# Patient Record
Sex: Male | Born: 1960 | Race: White | Hispanic: No | Marital: Single | Smoking: Never smoker
Health system: Southern US, Community
[De-identification: ages and names within clinical notes are randomized; demographics above are authoritative.]

## PROBLEM LIST (undated history)

## (undated) DIAGNOSIS — I10 Essential (primary) hypertension: Secondary | ICD-10-CM

## (undated) DIAGNOSIS — A4902 Methicillin resistant Staphylococcus aureus infection, unspecified site: Secondary | ICD-10-CM

## (undated) DIAGNOSIS — T8859XA Other complications of anesthesia, initial encounter: Secondary | ICD-10-CM

## (undated) DIAGNOSIS — E785 Hyperlipidemia, unspecified: Secondary | ICD-10-CM

## (undated) DIAGNOSIS — E119 Type 2 diabetes mellitus without complications: Secondary | ICD-10-CM

## (undated) DIAGNOSIS — Z89429 Acquired absence of other toe(s), unspecified side: Secondary | ICD-10-CM

## (undated) DIAGNOSIS — G4730 Sleep apnea, unspecified: Secondary | ICD-10-CM

## (undated) HISTORY — DX: Acquired absence of other toe(s), unspecified side: Z89.429

## (undated) HISTORY — DX: Hyperlipidemia, unspecified: E78.5

## (undated) HISTORY — PX: HERNIA REPAIR: SHX51

## (undated) HISTORY — PX: TOE AMPUTATION: SHX809

## (undated) HISTORY — PX: APPENDECTOMY: SHX54

## (undated) HISTORY — PX: TRACHEOSTOMY: SUR1362

## (undated) HISTORY — DX: Methicillin resistant Staphylococcus aureus infection, unspecified site: A49.02

---

## 2000-10-31 ENCOUNTER — Emergency Department (HOSPITAL_COMMUNITY): Admission: EM | Admit: 2000-10-31 | Discharge: 2000-10-31 | Payer: Self-pay | Admitting: *Deleted

## 2000-12-09 ENCOUNTER — Encounter: Payer: Self-pay | Admitting: Gastroenterology

## 2000-12-09 ENCOUNTER — Ambulatory Visit (HOSPITAL_COMMUNITY): Admission: RE | Admit: 2000-12-09 | Discharge: 2000-12-09 | Payer: Self-pay | Admitting: Gastroenterology

## 2001-01-06 ENCOUNTER — Encounter (INDEPENDENT_AMBULATORY_CARE_PROVIDER_SITE_OTHER): Payer: Self-pay | Admitting: Specialist

## 2001-01-06 ENCOUNTER — Ambulatory Visit (HOSPITAL_COMMUNITY): Admission: RE | Admit: 2001-01-06 | Discharge: 2001-01-06 | Payer: Self-pay | Admitting: Gastroenterology

## 2001-01-24 ENCOUNTER — Emergency Department (HOSPITAL_COMMUNITY): Admission: EM | Admit: 2001-01-24 | Discharge: 2001-01-24 | Payer: Self-pay

## 2001-07-21 HISTORY — PX: COLONOSCOPY: SHX174

## 2006-03-30 ENCOUNTER — Emergency Department (HOSPITAL_COMMUNITY): Admission: EM | Admit: 2006-03-30 | Discharge: 2006-03-31 | Payer: Self-pay | Admitting: Emergency Medicine

## 2007-01-20 ENCOUNTER — Emergency Department (HOSPITAL_COMMUNITY): Admission: EM | Admit: 2007-01-20 | Discharge: 2007-01-20 | Payer: Self-pay | Admitting: Emergency Medicine

## 2007-01-22 ENCOUNTER — Emergency Department (HOSPITAL_COMMUNITY): Admission: EM | Admit: 2007-01-22 | Discharge: 2007-01-22 | Payer: Self-pay | Admitting: Emergency Medicine

## 2008-04-14 ENCOUNTER — Encounter: Admission: RE | Admit: 2008-04-14 | Discharge: 2008-04-14 | Payer: Self-pay | Admitting: Surgery

## 2009-01-14 ENCOUNTER — Ambulatory Visit: Payer: Self-pay | Admitting: Cardiology

## 2011-07-18 ENCOUNTER — Emergency Department (HOSPITAL_COMMUNITY): Payer: BC Managed Care – PPO

## 2011-07-18 ENCOUNTER — Emergency Department (HOSPITAL_COMMUNITY)
Admission: EM | Admit: 2011-07-18 | Discharge: 2011-07-18 | Disposition: A | Discharge status: Home / Self Care | Payer: BC Managed Care – PPO | Attending: Emergency Medicine | Admitting: Emergency Medicine

## 2011-07-18 ENCOUNTER — Encounter: Payer: Self-pay | Admitting: Emergency Medicine

## 2011-07-18 DIAGNOSIS — R51 Headache: Secondary | ICD-10-CM | POA: Insufficient documentation

## 2011-07-18 DIAGNOSIS — J3489 Other specified disorders of nose and nasal sinuses: Secondary | ICD-10-CM | POA: Insufficient documentation

## 2011-07-18 DIAGNOSIS — J45909 Unspecified asthma, uncomplicated: Secondary | ICD-10-CM | POA: Insufficient documentation

## 2011-07-18 DIAGNOSIS — J069 Acute upper respiratory infection, unspecified: Secondary | ICD-10-CM | POA: Insufficient documentation

## 2011-07-18 DIAGNOSIS — R0981 Nasal congestion: Secondary | ICD-10-CM

## 2011-07-18 MED ORDER — SALINE NASAL SPRAY 0.65 % NA SOLN: NASAL | Status: DC

## 2011-07-18 NOTE — ED Notes (Signed)
Pt c/o pain and pressure in the left side of his head x 2 months but is worse in the last couple days. Pt states that the left side of his nose stays blocked and he has been using nasal spray. Pt also c/o " I feel like my breathing shuts down" during the night. Pt alert and oriented x 3. Skin warm and dry. Color pink.

## 2011-07-18 NOTE — ED Notes (Signed)
Pt also c/o blurred vision  

## 2011-07-18 NOTE — ED Provider Notes (Signed)
History     CSN: [id number]  Arrival date & time 07/18/11  1605   First MD Initiated Contact with Patient 07/18/11 1810      Chief Complaint  Patient presents with  . Headache  . Nasal Congestion    (Consider location/radiation/quality/duration/timing/severity/associated sxs/prior treatment) HPI Comments: Patient states for approximately 2 months he has been having headache and congestion on the left side. He feels that he has been having problems with sinus related issues. He has been using decongesting medicine and sinus sprays for extended period of time. In the last 4 days he's been having headache, lightheadedness, and some blurring of vision. He's not had any injury to the head, or high fever, or changes in medications, or other injury. He requested evaluation of this problem because he feels it is affecting his breathing his vision and causing pain.  Patient is a 50 y.o. male presenting with headaches. The history is provided by the patient.  Headache  Pertinent negatives include no palpitations and no shortness of breath.    Past Medical History  Diagnosis Date  . Asthma     Past Surgical History  Procedure Date  . Hernia repair   . Appendectomy     No family history on file.  History  Substance Use Topics  . Smoking status: Never Smoker   . Smokeless tobacco: Not on file  . Alcohol Use: No      Review of Systems  Constitutional: Negative for activity change.       All ROS Neg except as noted in HPI  HENT: Positive for congestion and postnasal drip. Negative for nosebleeds and neck pain.   Eyes: Negative for photophobia and discharge.  Respiratory: Negative for cough, shortness of breath and wheezing.   Cardiovascular: Negative for chest pain and palpitations.  Gastrointestinal: Negative for abdominal pain and blood in stool.  Genitourinary: Negative for dysuria, frequency and hematuria.  Musculoskeletal: Negative for back pain and arthralgias.  Skin:  Negative.   Neurological: Positive for light-headedness and headaches. Negative for dizziness, seizures and speech difficulty.  Psychiatric/Behavioral: Negative for hallucinations and confusion.    Allergies  Penicillins  Home Medications   Current Outpatient Rx  Name Route Sig Dispense Refill  . ATORVASTATIN CALCIUM 20 MG PO TABS Oral Take 20 mg by mouth at bedtime.      Marland Kitchen OXYMETAZOLINE HCL 0.05 % NA SOLN Nasal Place 2 sprays into the nose daily as needed. For congestion     . PHENYLEPHRINE-IBUPROFEN 10-200 MG PO TABS Oral Take 2 tablets by mouth as needed.      Marland Kitchen SINUS RELIEF PO Oral Take 2 tablets by mouth as needed. For congestion relief       BP 156/92  Pulse 82  Temp 98.2 F (36.8 C)  Resp 22  Ht 6\' 6"  (1.981 m)  Wt 202 lb (91.627 kg)  BMI 23.34 kg/m2  SpO2 99%  Physical Exam  Nursing note and vitals reviewed. Constitutional: He is oriented to person, place, and time. He appears well-developed and well-nourished.  Non-toxic appearance.  HENT:  Head: Normocephalic.  Right Ear: Tympanic membrane and external ear normal.  Left Ear: Tympanic membrane and external ear normal.       There is a small scabbed lesion in the anterior left nares. The mucous membranes appear mouth moderately dry. There is no pain to percussion of the left sinuses. No increased warmth of the right or left cheeks and sinus areas.  Eyes: EOM and lids are  normal. Pupils are equal, round, and reactive to light.  Neck: Normal range of motion. Neck supple. Carotid bruit is not present. No tracheal deviation present.  Cardiovascular: Normal rate, regular rhythm, normal heart sounds, intact distal pulses and normal pulses.   Pulmonary/Chest: Breath sounds normal. No respiratory distress.  Abdominal: Soft. Bowel sounds are normal. There is no tenderness. There is no guarding.  Musculoskeletal: Normal range of motion.  Lymphadenopathy:       Head (right side): No submandibular adenopathy present.        Head (left side): No submandibular adenopathy present.    He has no cervical adenopathy.  Neurological: He is alert and oriented to person, place, and time. He has normal strength. No cranial nerve deficit or sensory deficit.  Skin: Skin is warm and dry.  Psychiatric: His speech is normal. His mood appears anxious.    ED Course  Procedures (including critical care time)  Labs Reviewed - No data to display No results found. Pulse oximetry 99% on room air. Within normal limits by my interpretation.  No diagnosis found.    MDM  I have reviewed nursing notes, vital signs, and all appropriate lab and imaging results for this patient. CT scan reviewed with the patient. Vital signs reviewed with the patient. The danger of prolonged nasal spray use was discussed with the patient. Patient strongly advised to stop all nasal sprays and decongestant medications for the next week to 10 days. He is advised to see an ear nose and throat specialist if this problem continues. He was reassured that his CT scan was negative for acute problem. He is further advised to use saline nasal spray and a humidifier.     And a  Kathie Dike, [place] 07/18/11 1940

## 2011-07-18 NOTE — ED Notes (Signed)
Pt a/ox4. Resp even and unlabored. NAD at this time. D/C instructions reviewed with pt. Pt verbalized understanding. Pt ambulated to lobby with steady gate.  

## 2011-07-18 NOTE — ED Notes (Signed)
Pt c/o intermittent ha/congestion x 2 months.

## 2011-07-18 NOTE — ED Notes (Signed)
Received report from Bonnie RN.

## 2011-07-19 NOTE — ED Provider Notes (Signed)
Medical screening examination/treatment/procedure(s) were performed by non-physician practitioner and as supervising physician I was immediately available for consultation/collaboration.   Joya Gaskins, MD 07/19/11 0001

## 2011-09-15 ENCOUNTER — Encounter (HOSPITAL_COMMUNITY): Payer: Self-pay | Admitting: *Deleted

## 2011-09-15 ENCOUNTER — Emergency Department (HOSPITAL_COMMUNITY): Payer: BC Managed Care – PPO

## 2011-09-15 ENCOUNTER — Emergency Department (HOSPITAL_COMMUNITY)
Admission: EM | Admit: 2011-09-15 | Discharge: 2011-09-15 | Disposition: A | Discharge status: Home / Self Care | Payer: BC Managed Care – PPO | Attending: Emergency Medicine | Admitting: Emergency Medicine

## 2011-09-15 DIAGNOSIS — T148XXA Other injury of unspecified body region, initial encounter: Secondary | ICD-10-CM | POA: Insufficient documentation

## 2011-09-15 DIAGNOSIS — J45909 Unspecified asthma, uncomplicated: Secondary | ICD-10-CM | POA: Insufficient documentation

## 2011-09-15 DIAGNOSIS — X58XXXA Exposure to other specified factors, initial encounter: Secondary | ICD-10-CM | POA: Insufficient documentation

## 2011-09-15 DIAGNOSIS — R109 Unspecified abdominal pain: Secondary | ICD-10-CM | POA: Insufficient documentation

## 2011-09-15 LAB — BASIC METABOLIC PANEL
BUN: 10 mg/dL (ref 6–23)
CO2: 31 mEq/L (ref 19–32)
Calcium: 9.8 mg/dL (ref 8.4–10.5)
Creatinine, Ser: 0.95 mg/dL (ref 0.50–1.35)
GFR calc non Af Amer: >90 mL/min (ref >90)
Glucose, Bld: 107 mg/dL — ABNORMAL HIGH (ref 70–99)
Sodium: 139 mEq/L (ref 135–145)

## 2011-09-15 LAB — CBC
HCT: 45.9 % (ref 39.0–52.0)
MCH: 29.7 pg (ref 26.0–34.0)
MCV: 87.9 fL (ref 78.0–100.0)
Platelets: 187 10*3/uL (ref 150–400)
RBC: 5.22 MIL/uL (ref 4.22–5.81)

## 2011-09-15 LAB — URINALYSIS, ROUTINE W REFLEX MICROSCOPIC
Bilirubin Urine: NEGATIVE
Glucose, UA: NEGATIVE mg/dL
Hgb urine dipstick: NEGATIVE
Protein, ur: NEGATIVE mg/dL
Specific Gravity, Urine: 1.015 (ref 1.005–1.030)
Urobilinogen, UA: 0.2 mg/dL (ref 0.0–1.0)

## 2011-09-15 LAB — DIFFERENTIAL
Eosinophils Absolute: 0 10*3/uL (ref 0.0–0.7)
Eosinophils Relative: 1 % (ref 0–5)
Lymphs Abs: 1.2 10*3/uL (ref 0.7–4.0)
Monocytes Absolute: 0.4 10*3/uL (ref 0.1–1.0)

## 2011-09-15 MED ORDER — IBUPROFEN 800 MG PO TABS: 800.0000 mg | ORAL_TABLET | Freq: Three times a day (TID) | ORAL | Status: AC

## 2011-09-15 MED ORDER — SODIUM CHLORIDE 0.9 % IV SOLN: Freq: Once | INTRAVENOUS | Status: DC

## 2011-09-15 NOTE — ED Notes (Signed)
Pt reports pain in llq radiating into left groin, hip, and lower back.  Says pain got worse since 3 am this morning.  Also reports has had frequent urination.  Denies burning with urination.   Denies any n/v/d.

## 2011-09-15 NOTE — Discharge Instructions (Signed)
As discussed, your symptoms are most likely related to musculoskeletal strain or sciatica .  Try the medicine prescribed along with a heating pad applied to your left lower back and buttock area 20 minutes several times daily.  Return here for a recheck if your symtpoms worsen or change (such as you develop fevers,  Worse pain,  Nausea, etc).

## 2011-09-15 NOTE — ED Notes (Signed)
LLQ / groin pain began yesterday. Pt states urinary frequency yesterday ("going every 5 min).

## 2011-09-16 NOTE — ED Provider Notes (Signed)
[id number] Spoke with Dr. Janna Arch. He advised that patient did not meet criteria for admission and he wanted patient advised that he would see her in the office.  1610 Patient was advised of Dr. Otilio Saber recommendation. She will follow up in his office.  Nicoletta Dress. Colon Branch, MD 09/16/11 [phone]

## 2012-02-20 DIAGNOSIS — M6281 Muscle weakness (generalized): Secondary | ICD-10-CM

## 2012-03-08 ENCOUNTER — Telehealth: Payer: Self-pay | Admitting: *Deleted

## 2012-03-08 NOTE — Telephone Encounter (Signed)
Referring for screening, has hx of colon cancer.  Ms Luis Ford is available on the phone 6 am to 12 noon mon and fri, wed at the number from 2 -9 pm. It maybe a game of phone tag for awhile.

## 2012-03-11 NOTE — Telephone Encounter (Signed)
LMOM pt will need OV first.

## 2012-03-12 NOTE — Telephone Encounter (Signed)
Pt has an appointment on Tuesday at 8:00 with [facility]. His PCP is [place]

## 2012-03-16 ENCOUNTER — Ambulatory Visit: Payer: BC Managed Care – PPO | Admitting: Gastroenterology

## 2012-03-18 ENCOUNTER — Ambulatory Visit (INDEPENDENT_AMBULATORY_CARE_PROVIDER_SITE_OTHER): Payer: BC Managed Care – PPO | Admitting: Urgent Care

## 2012-03-18 ENCOUNTER — Encounter: Payer: Self-pay | Admitting: Urgent Care

## 2012-03-18 VITALS — BP 160/90 | HR 83 | Temp 97.9°F | Ht 78.0 in | Wt 213.6 lb

## 2012-03-18 DIAGNOSIS — Z809 Family history of malignant neoplasm, unspecified: Secondary | ICD-10-CM

## 2012-03-18 DIAGNOSIS — Z1211 Encounter for screening for malignant neoplasm of colon: Secondary | ICD-10-CM

## 2012-03-18 MED ORDER — PEG 3350-KCL-NA BICARB-NACL 420 G PO SOLR: 4000.0000 mL | ORAL | Status: AC

## 2012-03-18 NOTE — Assessment & Plan Note (Signed)
Luis Ford is a pleasant 51 y.o. male with family history of colon cancer dx in a brother in his early 31's.  Pt denies any significant GI concerns today.  I have discussed risks & benefits which include, but are not limited to, bleeding, infection, perforation & drug reaction.  The patient agrees with this plan & written consent will be obtained.     FU with PCP re hypertension

## 2012-03-18 NOTE — Patient Instructions (Addendum)
Colonoscopy w/ Dr Jena Gauss Please call & follow up with your primary care doctor about your blood pressure.  It was too high today.

## 2012-03-18 NOTE — Progress Notes (Signed)
Primary Care Physician:  [facility] Primary Gastroenterologist:  Dr. Rourk  Chief Complaint  Patient presents with  . Colonoscopy    Family Hx colon cancer  . Abdominal Pain    lower left    HPI:  Luis Ford is a 51 y.o. male here to set up high-risk colonoscopy.  He tells me he had a colonoscopy 10 yrs ago at [facility] that was normal.  He has a family history of a brother diagnosed w/ colon cancer at age 51.  He has had some mild transient  left-sided abdominal soreness, but otherwise denies any GI symptoms.  Denies constipation, diarrhea, rectal bleeding, melena or weight loss.  Rare indigestion, takes TUMS couple times per year.  Denies nausea, vomiting, dysphagia, odynophagia or anorexia.    Past Medical History  Diagnosis Date  . Asthma   . Hyperlipemia   . MRSA infection     Past Surgical History  Procedure Date  . Hernia repair     right inguinal  . Appendectomy   . Tracheostomy     infant/closure as child  . Colonoscopy 2003    [facility]-normal    Current Outpatient Prescriptions  Medication Sig Dispense Refill  . atorvastatin (LIPITOR) 10 MG tablet Take 10 mg by mouth daily.       . naproxen sodium (ANAPROX) 220 MG tablet Take 220 mg by mouth daily as needed.      . sodium chloride (OCEAN) 0.65 % nasal spray Place 1 spray into the nose at bedtime as needed. Congestion        Allergies as of 03/18/2012 - Review Complete 03/18/2012  Allergen Reaction Noted  . Penicillins Other (See Comments) 07/18/2011    Family History  Problem Relation Age of Onset  . Colon cancer Brother 51  . Coronary artery disease Mother     History   Social History  . Marital Status: Divorced    Spouse Name: N/A    Number of Children: 2  . Years of Education: N/A   Occupational History  . loader [employer]   Social History Main Topics  . Smoking status: Never Smoker   . Smokeless tobacco: Not on file  . Alcohol Use: No  . Drug Use: No  .  Sexually Active: Not on file   Other Topics Concern  . Not on file   Social History Narrative   2 healthy sons Lives w/ girlfriend    Review of Systems: Gen: Denies any fever, chills, sweats, anorexia, fatigue, weakness, malaise, weight loss, and sleep disorder CV: Denies chest pain, angina, palpitations, syncope, orthopnea, PND, peripheral edema, and claudication. Resp: Denies dyspnea at rest, dyspnea with exercise, cough, sputum, wheezing, coughing up blood, and pleurisy. GI: Denies vomiting blood, jaundice, and fecal incontinence.   Denies dysphagia or odynophagia. GU : Denies urinary burning, blood in urine, urinary frequency, urinary hesitancy, nocturnal urination, and urinary incontinence. MS: Denies joint pain, limitation of movement, and swelling, stiffness, low back pain, extremity pain. Denies muscle weakness, cramps, atrophy.  Derm: Denies rash, itching, dry skin, hives, moles, warts, or unhealing ulcers.  Psych: Denies depression, anxiety, memory loss, suicidal ideation, hallucinations, paranoia, and confusion. Heme: Denies bruising, bleeding, and enlarged lymph nodes. Neuro:  Denies any headaches, dizziness, paresthesias. Endo:  Denies any problems with DM, thyroid, adrenal function.  Physical Exam: BP 166/88  Pulse 83  Temp 97.9 F (36.6 C) (Temporal)  Ht 6' 6" (1.981 m)  Wt 213 lb 9.6 oz (96.888 kg)    BMI 24.68 kg/m2 No LMP for male patient. General:   Alert,  Well-developed, well-nourished, pleasant and cooperative in NAD Head:  Normocephalic and atraumatic. Eyes:  Sclera clear, no icterus.   Conjunctiva pink. Ears:  Normal auditory acuity. Nose:  No deformity, discharge, or lesions. Mouth:  No deformity or lesions,oropharynx pink & moist. Neck:  Supple; no masses or thyromegaly. Lungs:  Clear throughout to auscultation.   No wheezes, crackles, or rhonchi. No acute distress. Heart:  Regular rate and rhythm; no murmurs, clicks, rubs,  or gallops. Abdomen:  Mildly  Erythematous scar w/ dry skin @ RLQ from previous appendectomy/MRSA site.  No exudates or warmth.  Normal bowel sounds.  No bruits.  Soft, non-tender and non-distended without masses, hepatosplenomegaly or hernias noted.  No guarding or rebound tenderness.   Rectal:  Deferred.  Msk:  Symmetrical without gross deformities. Normal posture. Pulses:  Normal pulses noted. Extremities:  No clubbing or edema. Neurologic:  Alert and oriented x4;  grossly normal neurologically. Skin:  Intact without significant lesions or rashes. Lymph Nodes:  No significant cervical adenopathy. Psych:  Alert and cooperative. Normal mood and affect.   

## 2012-03-23 NOTE — Progress Notes (Signed)
Faxed to PCP

## 2012-04-01 ENCOUNTER — Encounter (HOSPITAL_COMMUNITY): Payer: Self-pay | Admitting: Pharmacy Technician

## 2012-04-14 MED ORDER — SODIUM CHLORIDE 0.45 % IV SOLN
INTRAVENOUS | Status: DC
  Administered 2012-04-15: 09:00:00 via INTRAVENOUS

## 2012-04-15 ENCOUNTER — Telehealth: Payer: Self-pay | Admitting: Internal Medicine

## 2012-04-15 ENCOUNTER — Encounter (HOSPITAL_COMMUNITY)
Admission: RE | Disposition: A | Discharge status: Home / Self Care | Payer: Self-pay | Source: Ambulatory Visit | Attending: Internal Medicine

## 2012-04-15 ENCOUNTER — Other Ambulatory Visit: Payer: Self-pay | Admitting: Internal Medicine

## 2012-04-15 ENCOUNTER — Encounter: Payer: Self-pay | Admitting: Internal Medicine

## 2012-04-15 ENCOUNTER — Ambulatory Visit (HOSPITAL_COMMUNITY)
Admission: RE | Admit: 2012-04-15 | Discharge: 2012-04-15 | Disposition: A | Discharge status: Home / Self Care | Payer: BC Managed Care – PPO | Source: Ambulatory Visit | Attending: Internal Medicine | Admitting: Internal Medicine

## 2012-04-15 ENCOUNTER — Encounter (HOSPITAL_COMMUNITY): Payer: Self-pay | Admitting: *Deleted

## 2012-04-15 DIAGNOSIS — Z809 Family history of malignant neoplasm, unspecified: Secondary | ICD-10-CM

## 2012-04-15 DIAGNOSIS — Z1211 Encounter for screening for malignant neoplasm of colon: Secondary | ICD-10-CM

## 2012-04-15 DIAGNOSIS — R933 Abnormal findings on diagnostic imaging of other parts of digestive tract: Secondary | ICD-10-CM

## 2012-04-15 DIAGNOSIS — Z800 Family history of malignant neoplasm of digestive organs: Secondary | ICD-10-CM

## 2012-04-15 HISTORY — PX: COLONOSCOPY: SHX5424

## 2012-04-15 SURGERY — COLONOSCOPY
Anesthesia: Moderate Sedation

## 2012-04-15 MED ORDER — MEPERIDINE HCL 100 MG/ML IJ SOLN
INTRAMUSCULAR | Status: AC
  Filled 2012-04-15: qty 2

## 2012-04-15 MED ORDER — MIDAZOLAM HCL 5 MG/5ML IJ SOLN
INTRAMUSCULAR | Status: DC | PRN
  Administered 2012-04-15: 2 mg via INTRAVENOUS
  Administered 2012-04-15: 1 mg via INTRAVENOUS
  Administered 2012-04-15: 2 mg via INTRAVENOUS

## 2012-04-15 MED ORDER — MIDAZOLAM HCL 5 MG/5ML IJ SOLN
INTRAMUSCULAR | Status: AC
  Filled 2012-04-15: qty 10

## 2012-04-15 MED ORDER — MEPERIDINE HCL 100 MG/ML IJ SOLN
INTRAMUSCULAR | Status: DC | PRN
  Administered 2012-04-15 (×2): 50 mg via INTRAVENOUS

## 2012-04-15 MED ORDER — STERILE WATER FOR IRRIGATION IR SOLN
Status: DC | PRN
  Administered 2012-04-15: 10:00:00

## 2012-04-15 NOTE — Interval H&P Note (Signed)
History and Physical Interval Note:  04/15/2012 9:37 AM  Luis Ford  has presented today for surgery, with the diagnosis of SCREENING  The various methods of treatment have been discussed with the patient and family. After consideration of risks, benefits and other options for treatment, the patient has consented to  Procedure(s) (LRB) with comments: COLONOSCOPY (N/A) - 8:45 as a surgical intervention .  The patient's history has been reviewed, patient examined, no change in status, stable for surgery.  I have reviewed the patient's chart and labs.  Questions were answered to the patient's satisfaction.     Robert Rourk   

## 2012-04-15 NOTE — Telephone Encounter (Signed)
I have mailed patient a letter to inform him of his CT scan on 10/01 at 2:30 I couldn't reach him by phone

## 2012-04-15 NOTE — H&P (View-Only) (Signed)
Primary Care Physician:  [facility] Primary Gastroenterologist:  Dr. Jena Gauss  Chief Complaint  Patient presents with  . Colonoscopy    Family Hx colon cancer  . Abdominal Pain    lower left    HPI:  Luis Ford is a 51 y.o. male here to set up high-risk colonoscopy.  He tells me he had a colonoscopy 10 yrs ago at [facility] that was normal.  He has a family history of a brother diagnosed w/ colon cancer at age 62.  He has had some mild transient  left-sided abdominal soreness, but otherwise denies any GI symptoms.  Denies constipation, diarrhea, rectal bleeding, melena or weight loss.  Rare indigestion, takes TUMS couple times per year.  Denies nausea, vomiting, dysphagia, odynophagia or anorexia.    Past Medical History  Diagnosis Date  . Asthma   . Hyperlipemia   . MRSA infection     Past Surgical History  Procedure Date  . Hernia repair     right inguinal  . Appendectomy   . Tracheostomy     infant/closure as child  . Colonoscopy 2003    [facility]-normal    Current Outpatient Prescriptions  Medication Sig Dispense Refill  . atorvastatin (LIPITOR) 10 MG tablet Take 10 mg by mouth daily.       . naproxen sodium (ANAPROX) 220 MG tablet Take 220 mg by mouth daily as needed.      . sodium chloride (OCEAN) 0.65 % nasal spray Place 1 spray into the nose at bedtime as needed. Congestion        Allergies as of 03/18/2012 - Review Complete 03/18/2012  Allergen Reaction Noted  . Penicillins Other (See Comments) 07/18/2011    Family History  Problem Relation Age of Onset  . Colon cancer Brother 57  . Coronary artery disease Mother     History   Social History  . Marital Status: Divorced    Spouse Name: N/A    Number of Children: 2  . Years of Education: N/A   Occupational History  . loader [employer]   Social History Main Topics  . Smoking status: Never Smoker   . Smokeless tobacco: Not on file  . Alcohol Use: No  . Drug Use: No  .  Sexually Active: Not on file   Other Topics Concern  . Not on file   Social History Narrative   2 healthy sons Lives w/ girlfriend    Review of Systems: Gen: Denies any fever, chills, sweats, anorexia, fatigue, weakness, malaise, weight loss, and sleep disorder CV: Denies chest pain, angina, palpitations, syncope, orthopnea, PND, peripheral edema, and claudication. Resp: Denies dyspnea at rest, dyspnea with exercise, cough, sputum, wheezing, coughing up blood, and pleurisy. GI: Denies vomiting blood, jaundice, and fecal incontinence.   Denies dysphagia or odynophagia. GU : Denies urinary burning, blood in urine, urinary frequency, urinary hesitancy, nocturnal urination, and urinary incontinence. MS: Denies joint pain, limitation of movement, and swelling, stiffness, low back pain, extremity pain. Denies muscle weakness, cramps, atrophy.  Derm: Denies rash, itching, dry skin, hives, moles, warts, or unhealing ulcers.  Psych: Denies depression, anxiety, memory loss, suicidal ideation, hallucinations, paranoia, and confusion. Heme: Denies bruising, bleeding, and enlarged lymph nodes. Neuro:  Denies any headaches, dizziness, paresthesias. Endo:  Denies any problems with DM, thyroid, adrenal function.  Physical Exam: BP 166/88  Pulse 83  Temp 97.9 F (36.6 C) (Temporal)  Ht 6\' 6"  (1.981 m)  Wt 213 lb 9.6 oz (96.888 kg)  BMI 24.68 kg/m2 No LMP for male patient. General:   Alert,  Well-developed, well-nourished, pleasant and cooperative in NAD Head:  Normocephalic and atraumatic. Eyes:  Sclera clear, no icterus.   Conjunctiva pink. Ears:  Normal auditory acuity. Nose:  No deformity, discharge, or lesions. Mouth:  No deformity or lesions,oropharynx pink & moist. Neck:  Supple; no masses or thyromegaly. Lungs:  Clear throughout to auscultation.   No wheezes, crackles, or rhonchi. No acute distress. Heart:  Regular rate and rhythm; no murmurs, clicks, rubs,  or gallops. Abdomen:  Mildly  Erythematous scar w/ dry skin @ RLQ from previous appendectomy/MRSA site.  No exudates or warmth.  Normal bowel sounds.  No bruits.  Soft, non-tender and non-distended without masses, hepatosplenomegaly or hernias noted.  No guarding or rebound tenderness.   Rectal:  Deferred.  Msk:  Symmetrical without gross deformities. Normal posture. Pulses:  Normal pulses noted. Extremities:  No clubbing or edema. Neurologic:  Alert and oriented x4;  grossly normal neurologically. Skin:  Intact without significant lesions or rashes. Lymph Nodes:  No significant cervical adenopathy. Psych:  Alert and cooperative. Normal mood and affect.

## 2012-04-15 NOTE — Op Note (Signed)
[facility] [address] [place], [address]   COLONOSCOPY PROCEDURE REPORT  PATIENT: Luis Ford, Luis Ford  MR#:         [id number] BIRTHDATE: 09-28-1960 , 51  yrs. old GENDER: Male ENDOSCOPIST: R.  Roetta Sessions, MD FACP FACG REFERRED BY:  Rudi Heap, M.D. PROCEDURE DATE:  04/15/2012 PROCEDURE:     screening colonoscopy  INDICATIONS: high-risk colorectal cancer screening  INFORMED CONSENT:  The risks, benefits, alternatives and imponderables including but not limited to bleeding, perforation as well as the possibility of a missed lesion have been reviewed.  The potential for biopsy, lesion removal, etc. have also been discussed.  Questions have been answered.  All parties agreeable. Please see the history and physical in the medical record for more information.  MEDICATIONS: Versed 5 mg IV and Demerol 100 mg IV in divided doses.  DESCRIPTION OF PROCEDURE:  After a digital rectal exam was performed, the EC-[id number] ([id number])  colonoscope was advanced from the anus through the rectum and colon to the area of the cecum, ileocecal valve and appendiceal orifice.  The cecum was deeply intubated.  These structures were well-seen and photographed for the record.  From the level of the cecum and ileocecal valve, the scope was slowly and cautiously withdrawn.  The mucosal surfaces were carefully surveyed utilizing scope tip deflection to facilitate fold flattening as needed.  The scope was pulled down into the rectum where a thorough examination including retroflexion was performed.    FINDINGS:  The preparation. Normal rectum. Colonic mucosa appeared normal except for a 1 cm submucosal bulge arising out of the base of the cecum/appendiceal orifice. This was submucosal (Images 1, 3 and 4).  THERAPEUTIC / DIAGNOSTIC MANEUVERS PERFORMED:  none  COMPLICATIONS: none  CECAL WITHDRAWAL TIME:  9 minutes  IMPRESSION:  Abnormal cecum/appendiceal orifice of  uncertain significance. Query mucocele versus other process  RECOMMENDATIONS: CT of abdomen and pelvis to further evaluate. Further recommendations to follow.   _______________________________ eSigned:  R. Roetta Sessions, MD FACP [facility] 04/15/2012 10:10 AM   CC:    PATIENT NAME:  Luis Ford, Luis Ford MR#: [id number]

## 2012-04-15 NOTE — Interval H&P Note (Signed)
History and Physical Interval Note:  04/15/2012 9:37 AM  Luis Ford  has presented today for surgery, with the diagnosis of SCREENING  The various methods of treatment have been discussed with the patient and family. After consideration of risks, benefits and other options for treatment, the patient has consented to  Procedure(s) (LRB) with comments: COLONOSCOPY (N/A) - 8:45 as a surgical intervention .  The patient's history has been reviewed, patient examined, no change in status, stable for surgery.  I have reviewed the patient's chart and labs.  Questions were answered to the patient's satisfaction.     Eula Listen

## 2012-04-20 ENCOUNTER — Ambulatory Visit (HOSPITAL_COMMUNITY)
Admission: RE | Admit: 2012-04-20 | Discharge: 2012-04-20 | Disposition: A | Discharge status: Home / Self Care | Payer: BC Managed Care – PPO | Source: Ambulatory Visit | Attending: Internal Medicine | Admitting: Internal Medicine

## 2012-04-20 DIAGNOSIS — K7689 Other specified diseases of liver: Secondary | ICD-10-CM | POA: Insufficient documentation

## 2012-04-20 DIAGNOSIS — R933 Abnormal findings on diagnostic imaging of other parts of digestive tract: Secondary | ICD-10-CM

## 2012-04-20 MED ORDER — IOHEXOL 300 MG/ML  SOLN
100.0000 mL | Freq: Once | INTRAMUSCULAR | Status: AC | PRN
  Administered 2012-04-20: 100 mL via INTRAVENOUS

## 2012-04-21 ENCOUNTER — Encounter (HOSPITAL_COMMUNITY): Payer: Self-pay | Admitting: Internal Medicine

## 2012-04-26 ENCOUNTER — Other Ambulatory Visit: Payer: Self-pay | Admitting: Internal Medicine

## 2012-04-26 DIAGNOSIS — K769 Liver disease, unspecified: Secondary | ICD-10-CM

## 2012-07-27 ENCOUNTER — Encounter (HOSPITAL_COMMUNITY): Payer: Self-pay | Admitting: *Deleted

## 2012-07-27 ENCOUNTER — Emergency Department (HOSPITAL_COMMUNITY): Payer: BC Managed Care – PPO

## 2012-07-27 ENCOUNTER — Emergency Department (HOSPITAL_COMMUNITY)
Admission: EM | Admit: 2012-07-27 | Discharge: 2012-07-27 | Disposition: A | Discharge status: Home / Self Care | Payer: BC Managed Care – PPO | Attending: Emergency Medicine | Admitting: Emergency Medicine

## 2012-07-27 DIAGNOSIS — E785 Hyperlipidemia, unspecified: Secondary | ICD-10-CM | POA: Insufficient documentation

## 2012-07-27 DIAGNOSIS — J45901 Unspecified asthma with (acute) exacerbation: Secondary | ICD-10-CM | POA: Insufficient documentation

## 2012-07-27 DIAGNOSIS — Z930 Tracheostomy status: Secondary | ICD-10-CM | POA: Insufficient documentation

## 2012-07-27 DIAGNOSIS — R05 Cough: Secondary | ICD-10-CM | POA: Insufficient documentation

## 2012-07-27 DIAGNOSIS — Z8614 Personal history of Methicillin resistant Staphylococcus aureus infection: Secondary | ICD-10-CM | POA: Insufficient documentation

## 2012-07-27 DIAGNOSIS — R059 Cough, unspecified: Secondary | ICD-10-CM | POA: Insufficient documentation

## 2012-07-27 DIAGNOSIS — J40 Bronchitis, not specified as acute or chronic: Secondary | ICD-10-CM | POA: Insufficient documentation

## 2012-07-27 DIAGNOSIS — J3489 Other specified disorders of nose and nasal sinuses: Secondary | ICD-10-CM | POA: Insufficient documentation

## 2012-07-27 LAB — BASIC METABOLIC PANEL
BUN: 12 mg/dL (ref 6–23)
Calcium: 9.7 mg/dL (ref 8.4–10.5)
Creatinine, Ser: 0.98 mg/dL (ref 0.50–1.35)
GFR calc Af Amer: >90 mL/min (ref >90)
GFR calc non Af Amer: >90 mL/min (ref >90)
Glucose, Bld: 107 mg/dL — ABNORMAL HIGH (ref 70–99)
Potassium: 3.5 mEq/L (ref 3.5–5.1)

## 2012-07-27 LAB — CBC
MCH: 29.6 pg (ref 26.0–34.0)
MCHC: 33.9 g/dL (ref 30.0–36.0)
RDW: 13 % (ref 11.5–15.5)

## 2012-07-27 LAB — D-DIMER, QUANTITATIVE: D-Dimer, Quant: <0.27 ug/mL-FEU (ref 0.00–0.48)

## 2012-07-27 MED ORDER — PSEUDOEPHEDRINE HCL ER 120 MG PO TB12: 120.0000 mg | ORAL_TABLET | Freq: Two times a day (BID) | ORAL | Status: DC

## 2012-07-27 MED ORDER — ACETAMINOPHEN 325 MG PO TABS
650.0000 mg | ORAL_TABLET | Freq: Once | ORAL | Status: AC
  Administered 2012-07-27: 650 mg via ORAL
  Filled 2012-07-27: qty 2

## 2012-07-27 MED ORDER — BENZONATATE 100 MG PO CAPS
200.0000 mg | ORAL_CAPSULE | Freq: Three times a day (TID) | ORAL | Status: DC | PRN
  Administered 2012-07-27: 200 mg via ORAL
  Filled 2012-07-27: qty 2

## 2012-07-27 MED ORDER — BENZONATATE 100 MG PO CAPS: 100.0000 mg | ORAL_CAPSULE | Freq: Three times a day (TID) | ORAL | Status: DC

## 2012-07-27 MED ORDER — PSEUDOEPHEDRINE HCL 60 MG PO TABS
60.0000 mg | ORAL_TABLET | Freq: Once | ORAL | Status: AC
  Administered 2012-07-27: 60 mg via ORAL
  Filled 2012-07-27: qty 1

## 2012-07-27 MED ORDER — ALBUTEROL SULFATE HFA 108 (90 BASE) MCG/ACT IN AERS
1.0000 | INHALATION_SPRAY | RESPIRATORY_TRACT | Status: DC | PRN
  Administered 2012-07-27: 2 via RESPIRATORY_TRACT
  Filled 2012-07-27: qty 6.7

## 2012-07-27 MED ORDER — ALBUTEROL SULFATE (5 MG/ML) 0.5% IN NEBU
5.0000 mg | INHALATION_SOLUTION | Freq: Once | RESPIRATORY_TRACT | Status: AC
  Administered 2012-07-27: 5 mg via RESPIRATORY_TRACT
  Filled 2012-07-27: qty 1

## 2012-07-27 NOTE — ED Provider Notes (Signed)
History   This chart was scribed for Celene Kras, MD by Donne Anon, ED Scribe. This patient was seen in room [facility] and the patient's care was started at 1611.   CSN: [id number]  Arrival date & time 07/27/12  1611   First MD Initiated Contact with Patient 07/27/12 1711      Chief Complaint  Patient presents with  . Shortness of Breath     The history is provided by the patient. No language interpreter was used.   Luis Ford is a 52 y.o. male who presents to the Emergency Department complaining of constant, sudden onset, moderate SOB which began yesterday. He describes it as a "blockage" which begins in his nose and radiates to his chest. He reports an associated dry cough, sneezing, congestion. He denies leg edema, recent long car trips, history of blood clots or any other pain. He has a history of childhood asthma. He is not a smoker.  Past Medical History  Diagnosis Date  . Asthma   . Hyperlipemia   . MRSA infection     Past Surgical History  Procedure Date  . Hernia repair     right inguinal  . Appendectomy   . Tracheostomy     infant/closure as child  . Colonoscopy 2003    [facility]-normal  . Colonoscopy 04/15/2012    Procedure: COLONOSCOPY;  Surgeon: Corbin Ade, MD;  Location: [facility];  Service: Endoscopy;  Laterality: N/A;  8:45    Family History  Problem Relation Age of Onset  . Colon cancer Brother 12  . Coronary artery disease Mother     History  Substance Use Topics  . Smoking status: Never Smoker   . Smokeless tobacco: Not on file  . Alcohol Use: No      Review of Systems  All other systems reviewed and are negative.   10 Systems reviewed and all are negative for acute change except as noted in the HPI.   Allergies  Penicillins  Home Medications   Current Outpatient Rx  Name  Route  Sig  Dispense  Refill  . ATORVASTATIN CALCIUM 10 MG PO TABS   Oral   Take 10 mg by mouth daily.          Marland Kitchen OXYMETAZOLINE HCL 0.05 % NA  SOLN   Nasal   Place 2 sprays into the nose 2 (two) times daily.           Triage Vitals: BP 176/96  Pulse 96  Temp 98 F (36.7 C)  Resp 16  Ht 6\' 6"  (1.981 m)  Wt 215 lb (97.523 kg)  BMI 24.85 kg/m2  SpO2 100%  Physical Exam  Nursing note and vitals reviewed. Constitutional: He appears well-developed and well-nourished. No distress.       Frequent coughing  HENT:  Head: Normocephalic and atraumatic.  Right Ear: External ear normal.  Left Ear: External ear normal.  Mouth/Throat: No oropharyngeal exudate.       Clear nasal discharge right nare  Eyes: Conjunctivae normal are normal. Right eye exhibits no discharge. Left eye exhibits no discharge. No scleral icterus.  Neck: Neck supple. No tracheal deviation present.  Cardiovascular: Normal rate, regular rhythm and intact distal pulses.   Pulmonary/Chest: Effort normal and breath sounds normal. No stridor. No respiratory distress. He has no wheezes. He has no rales.  Abdominal: Soft. Bowel sounds are normal. He exhibits no distension. There is no tenderness. There is no rebound and no guarding.  Musculoskeletal:  He exhibits no edema and no tenderness.  Neurological: He is alert. He has normal strength. No sensory deficit. Cranial nerve deficit:  no gross defecits noted. He exhibits normal muscle tone. He displays no seizure activity. Coordination normal.  Skin: Skin is warm and dry. No rash noted.  Psychiatric: He has a normal mood and affect.    ED Course  Procedures (including critical care time) DIAGNOSTIC STUDIES: Oxygen Saturation is 100% on room air, normal by my interpretation.    COORDINATION OF CARE: 5:15 PM Discussed treatment plan which includes labs with pt at bedside and pt agreed to plan.   EKG Normal sinus rhythm, rate 100 Normal axis Normal intervals No ST-T wave changes No prior EKG for comparison   Labs Reviewed  BASIC METABOLIC PANEL - Abnormal; Notable for the following:    Glucose, Bld 107  (*)     All other components within normal limits  PRO B NATRIURETIC PEPTIDE  CBC  D-DIMER, QUANTITATIVE   Dg Chest 2 View  07/27/2012  *RADIOLOGY REPORT*  Clinical Data: 52 year old male with cough and shortness of breath.  CHEST - 2 VIEW  Comparison: 02/20/2012  Findings: The cardiomediastinal silhouette is unremarkable. The lungs are clear. There is no evidence of focal airspace disease, pulmonary edema, suspicious pulmonary nodule/mass, pleural effusion, or pneumothorax. No acute bony abnormalities are identified.  IMPRESSION: No evidence of active cardiopulmonary disease.   Original Report Authenticated By: Harmon Pier, M.D.      1. Bronchitis       MDM  I doubt pneumonia, pulmonary embolism or cardiac etiology for his complaints of shortness of breath.  When the patient is describing his shortness of breath he primarily is referring to the difficulty breathing through his nose. I suspect his symptoms are related to a viral infection possibly influenza. He'll be discharged home with medications to help him with his congestion. I will prescribe Sudafed Tessalon and an albuterol inhaler. I instructed him to followup with his Dr. who is not getting any better. He may take a week or so for his symptoms to improve    I personally performed the services described in this documentation, which was scribed in my presence.  The recorded information has been reviewed and considered.        Celene Kras, MD 07/27/12 [phone]

## 2012-07-27 NOTE — ED Notes (Signed)
Pt c/o headache. EDP notified. Pt has increased nasal congestion at this time. BBS clear equal but slightly diminished in lower lung fields. Upper airway congestion audible.  Pt states feels winded. No acute distress noted.  Denies fever at this time.

## 2012-07-27 NOTE — ED Notes (Addendum)
Pt c/o "dry" cough that started last night, sinus pressure on left side of face for the past 5 days, pain under left rib area for the past two day,  today feels like he is not able to "catch his breath", denies any fever, n/v/d

## 2013-03-31 ENCOUNTER — Ambulatory Visit (INDEPENDENT_AMBULATORY_CARE_PROVIDER_SITE_OTHER): Payer: BC Managed Care – PPO | Admitting: Family Medicine

## 2013-03-31 ENCOUNTER — Encounter: Payer: Self-pay | Admitting: Family Medicine

## 2013-03-31 VITALS — BP 174/96 | HR 74 | Temp 98.5°F | Ht 78.0 in | Wt 229.8 lb

## 2013-03-31 DIAGNOSIS — J329 Chronic sinusitis, unspecified: Secondary | ICD-10-CM

## 2013-03-31 MED ORDER — METHYLPREDNISOLONE (PAK) 4 MG PO TABS: ORAL_TABLET | ORAL | Status: DC

## 2013-03-31 MED ORDER — LEVOFLOXACIN 500 MG PO TABS: 500.0000 mg | ORAL_TABLET | Freq: Every day | ORAL | Status: DC

## 2013-03-31 NOTE — Patient Instructions (Signed)

## 2013-03-31 NOTE — Progress Notes (Signed)
  Subjective:    Patient ID: Luis Ford, male    DOB: 27-Dec-1960, 52 y.o.   MRN: [id number]  HPI  This 52 y.o. male presents for evaluation of facial pain and uri sx's for over  2 weeks.  He c/o mucopurulent sinus drainage and fatigue.  Review of Systems C/o uri sx's and facial pain    No chest pain, SOB, HA, dizziness, vision change, N/V, diarrhea, constipation, dysuria, urinary urgency or frequency, myalgias, arthralgias or rash.  Objective:   Physical Exam Vital signs noted  Well developed well nourished male.  HEENT - Head atraumatic Normocephalic                Eyes - PERRLA, Conjuctiva - clear Sclera- Clear EOMI                Ears - EAC's Wnl TM's Wnl Gross Hearing WNL                Nose - Nares patent                 Throat - oropharanx wnl                Face - TTP bilateral maxillary sinuses. Respiratory - Lungs CTA bilateral Cardiac - RRR S1 and S2 without murmur GI - Abdomen soft Nontender and bowel sounds active x 4 Extremities - No edema. Neuro - Grossly intact.       Assessment & Plan:  Sinusitis - Plan: methylPREDNIsolone (MEDROL DOSPACK) 4 MG tablet, levofloxacin (LEVAQUIN) 500 MG tablet po qd x 14 days.   Push po fluids, rest, tylenol and motrin otc prn.

## 2013-10-25 ENCOUNTER — Encounter: Payer: Self-pay | Admitting: Internal Medicine

## 2014-06-20 ENCOUNTER — Telehealth: Payer: Self-pay | Admitting: Family Medicine

## 2014-06-20 NOTE — Telephone Encounter (Signed)
Appointment given for 12:15 tomorrow with Ander SladeBill Oxford, FNP

## 2014-06-21 ENCOUNTER — Ambulatory Visit (INDEPENDENT_AMBULATORY_CARE_PROVIDER_SITE_OTHER): Payer: BC Managed Care – PPO | Admitting: Family Medicine

## 2014-06-21 VITALS — BP 157/87 | HR 84 | Temp 97.5°F | Ht 78.0 in | Wt 238.0 lb

## 2014-06-21 DIAGNOSIS — J0110 Acute frontal sinusitis, unspecified: Secondary | ICD-10-CM

## 2014-06-21 DIAGNOSIS — I10 Essential (primary) hypertension: Secondary | ICD-10-CM

## 2014-06-21 DIAGNOSIS — J0111 Acute recurrent frontal sinusitis: Secondary | ICD-10-CM

## 2014-06-21 MED ORDER — BENZONATATE 100 MG PO CAPS: 100.0000 mg | ORAL_CAPSULE | Freq: Three times a day (TID) | ORAL | Status: DC

## 2014-06-21 MED ORDER — LEVOFLOXACIN 500 MG PO TABS: 500.0000 mg | ORAL_TABLET | Freq: Every day | ORAL | Status: DC

## 2014-06-21 MED ORDER — METHYLPREDNISOLONE (PAK) 4 MG PO TABS: ORAL_TABLET | ORAL | Status: DC

## 2014-06-21 MED ORDER — AMLODIPINE BESYLATE 5 MG PO TABS: 5.0000 mg | ORAL_TABLET | Freq: Every day | ORAL | Status: DC

## 2014-06-21 NOTE — Progress Notes (Signed)
   Subjective:    Patient ID: Luis Ford, male    DOB: 1961-07-02, 53 y.o.   MRN: [id number]  HPI Patient is here with c/o cough and uri sx's  Review of Systems  Constitutional: Negative for fever.  HENT: Negative for ear pain.   Eyes: Negative for discharge.  Respiratory: Negative for cough.   Cardiovascular: Negative for chest pain.  Gastrointestinal: Negative for abdominal distention.  Endocrine: Negative for polyuria.  Genitourinary: Negative for difficulty urinating.  Musculoskeletal: Negative for gait problem and neck pain.  Skin: Negative for color change and rash.  Neurological: Negative for speech difficulty and headaches.  Psychiatric/Behavioral: Negative for agitation.       Objective:    BP 157/87 mmHg  Pulse 84  Temp(Src) 97.5 F (36.4 C) (Oral)  Ht 6\' 6"  (1.981 m)  Wt 238 lb (107.956 kg)  BMI 27.51 kg/m2   Physical Exam  Constitutional: He is oriented to person, place, and time. He appears well-developed and well-nourished.  HENT:  Head: Normocephalic and atraumatic.  Mouth/Throat: Oropharynx is clear and moist.  Eyes: Pupils are equal, round, and reactive to light.  Neck: Normal range of motion. Neck supple.  Cardiovascular: Normal rate and regular rhythm.   No murmur heard. Pulmonary/Chest: Effort normal and breath sounds normal.  Abdominal: Soft. Bowel sounds are normal. There is no tenderness.  Neurological: He is alert and oriented to person, place, and time.  Skin: Skin is warm and dry.  Psychiatric: He has a normal mood and affect.          Assessment & Plan:     ICD-9-CM ICD-10-CM   1. Acute recurrent frontal sinusitis 461.1 J01.11 levofloxacin (LEVAQUIN) 500 MG tablet     benzonatate (TESSALON) 100 MG capsule  2. Subacute frontal sinusitis 461.1 J01.10 methylPREDNIsolone (MEDROL DOSPACK) 4 MG tablet  3. Essential hypertension 401.9 I10 amLODipine (NORVASC) 5 MG tablet     Return if symptoms worsen or fail to improve.  Deatra CanterWilliam  J Tamarah Bhullar FNP

## 2015-02-06 ENCOUNTER — Encounter: Payer: Self-pay | Admitting: Family

## 2015-02-06 ENCOUNTER — Ambulatory Visit (INDEPENDENT_AMBULATORY_CARE_PROVIDER_SITE_OTHER): Payer: BLUE CROSS/BLUE SHIELD | Admitting: Family

## 2015-02-06 VITALS — BP 185/129 | HR 104 | Temp 97.3°F | Ht 78.0 in | Wt 237.0 lb

## 2015-02-06 DIAGNOSIS — M5441 Lumbago with sciatica, right side: Secondary | ICD-10-CM

## 2015-02-06 DIAGNOSIS — I10 Essential (primary) hypertension: Secondary | ICD-10-CM | POA: Diagnosis not present

## 2015-02-06 MED ORDER — CYCLOBENZAPRINE HCL 10 MG PO TABS: 10.0000 mg | ORAL_TABLET | Freq: Three times a day (TID) | ORAL | Status: DC | PRN

## 2015-02-06 MED ORDER — KETOROLAC TROMETHAMINE 60 MG/2ML IM SOLN
60.0000 mg | Freq: Once | INTRAMUSCULAR | Status: AC
  Administered 2015-02-06: 60 mg via INTRAMUSCULAR

## 2015-02-06 MED ORDER — MELOXICAM 15 MG PO TABS: 15.0000 mg | ORAL_TABLET | Freq: Every day | ORAL | Status: DC

## 2015-02-06 MED ORDER — METHYLPREDNISOLONE ACETATE 80 MG/ML IJ SUSP
80.0000 mg | Freq: Once | INTRAMUSCULAR | Status: AC
  Administered 2015-02-06: 80 mg via INTRAMUSCULAR

## 2015-02-06 MED ORDER — LISINOPRIL-HYDROCHLOROTHIAZIDE 20-12.5 MG PO TABS: 1.0000 | ORAL_TABLET | Freq: Every day | ORAL | Status: DC

## 2015-02-06 NOTE — Progress Notes (Signed)
Subjective:    Patient ID: Luis Ford, male    DOB: Oct 24, 1960, 54 y.o.   MRN: [id number]  Pt presents to office today for hip that started Friday. Pt's BP is extremely elevated. Pt has been off his BP medication because he states his "BP was higher when he took the medication". Hip Pain  The incident occurred 3 to 5 days ago. There was no injury mechanism. The pain is present in the right hip (right groin). The quality of the pain is described as aching. The pain is at a severity of 5/10. The pain is moderate. The pain has been fluctuating since onset. Pertinent negatives include no loss of motion, muscle weakness, numbness or tingling. He reports no foreign bodies present. The symptoms are aggravated by movement. He has tried NSAIDs and rest for the symptoms. The treatment provided mild relief.      Review of Systems  Constitutional: Negative.   HENT: Negative.   Respiratory: Negative.   Cardiovascular: Negative.   Gastrointestinal: Negative.   Endocrine: Negative.   Genitourinary: Negative.   Musculoskeletal: Negative.   Neurological: Negative.  Negative for tingling and numbness.  Hematological: Negative.   Psychiatric/Behavioral: Negative.   All other systems reviewed and are negative.      Objective:   Physical Exam  Constitutional: He is oriented to person, place, and time. He appears well-developed and well-nourished. No distress.  HENT:  Head: Normocephalic.  Right Ear: External ear normal.  Left Ear: External ear normal.  Mouth/Throat: Oropharynx is clear and moist.  Eyes: Pupils are equal, round, and reactive to light. Right eye exhibits no discharge. Left eye exhibits no discharge.  Neck: Normal range of motion. Neck supple. No thyromegaly present.  Cardiovascular: Normal rate, regular rhythm, normal heart sounds and intact distal pulses.   No murmur heard. Pulmonary/Chest: Effort normal and breath sounds normal. No respiratory distress. He has no wheezes.    Abdominal: Soft. Bowel sounds are normal. He exhibits no distension. There is no tenderness.  Genitourinary: No penile tenderness.  Musculoskeletal: Normal range of motion. He exhibits no edema or tenderness.  Neurological: He is alert and oriented to person, place, and time. He has normal reflexes. No cranial nerve deficit.  Skin: Skin is warm and dry. No rash noted. No erythema.  Psychiatric: He has a normal mood and affect. His behavior is normal. Judgment and thought content normal.  Vitals reviewed.     BP 185/129 mmHg  Pulse 104  Temp(Src) 97.3 F (36.3 C) (Oral)  Ht 6' 6" (1.981 m)  Wt 237 lb (107.502 kg)  BMI 27.39 kg/m2     Assessment & Plan:  1. Essential hypertension -Pt started on Zestoretic 20-12.5 mg today -Dash diet information given -Exercise encouraged - Stress Management  -Continue current meds -RTO in 2 weeks - lisinopril-hydrochlorothiazide (ZESTORETIC) 20-12.5 MG per tablet; Take 1 tablet by mouth daily.  Dispense: 90 tablet; Refill: 3 - BMP8+EGFR  2. Right-sided low back pain with right-sided sciatica -Rest -Ice -No other NSAID's while talking Mobic -Sedation precaution discussed - ketorolac (TORADOL) injection 60 mg; Inject 2 mLs (60 mg total) into the muscle once. - methylPREDNISolone acetate (DEPO-MEDROL) injection 80 mg; Inject 1 mL (80 mg total) into the muscle once. - cyclobenzaprine (FLEXERIL) 10 MG tablet; Take 1 tablet (10 mg total) by mouth 3 (three) times daily as needed for muscle spasms.  Dispense: 30 tablet; Refill: 0 - meloxicam (MOBIC) 15 MG tablet; Take 1 tablet (15 mg total) by mouth  daily.  Dispense: 30 tablet; Refill: 0 - BMP8+EGFR  Evelina Dun, FNP

## 2015-02-06 NOTE — Patient Instructions (Signed)
DASH Eating Plan DASH stands for "Dietary Approaches to Stop Hypertension." The DASH eating plan is a healthy eating plan that has been shown to reduce high blood pressure (hypertension). Additional health benefits may include reducing the risk of type 2 diabetes mellitus, heart disease, and stroke. The DASH eating plan may also help with weight loss. WHAT DO I NEED TO KNOW ABOUT THE DASH EATING PLAN? For the DASH eating plan, you will follow these general guidelines:  Choose foods with a percent daily value for sodium of less than 5% (as listed on the food label).  Use salt-free seasonings or herbs instead of table salt or sea salt.  Check with your health care provider or pharmacist before using salt substitutes.  Eat lower-sodium products, often labeled as "lower sodium" or "no salt added."  Eat fresh foods.  Eat more vegetables, fruits, and low-fat dairy products.  Choose whole grains. Look for the word "whole" as the first word in the ingredient list.  Choose fish and skinless chicken or turkey more often than red meat. Limit fish, poultry, and meat to 6 oz (170 g) each day.  Limit sweets, desserts, sugars, and sugary drinks.  Choose heart-healthy fats.  Limit cheese to 1 oz (28 g) per day.  Eat more home-cooked food and less restaurant, buffet, and fast food.  Limit fried foods.  Cook foods using methods other than frying.  Limit canned vegetables. If you do use them, rinse them well to decrease the sodium.  When eating at a restaurant, ask that your food be prepared with less salt, or no salt if possible. WHAT FOODS CAN I EAT? Seek help from a dietitian for individual calorie needs. Grains Whole grain or whole wheat bread. Brown rice. Whole grain or whole wheat pasta. Quinoa, bulgur, and whole grain cereals. Low-sodium cereals. Corn or whole wheat flour tortillas. Whole grain cornbread. Whole grain crackers. Low-sodium crackers. Vegetables Fresh or frozen vegetables  (raw, steamed, roasted, or grilled). Low-sodium or reduced-sodium tomato and vegetable juices. Low-sodium or reduced-sodium tomato sauce and paste. Low-sodium or reduced-sodium canned vegetables.  Fruits All fresh, canned (in natural juice), or frozen fruits. Meat and Other Protein Products Ground beef (85% or leaner), grass-fed beef, or beef trimmed of fat. Skinless chicken or turkey. Ground chicken or turkey. Pork trimmed of fat. All fish and seafood. Eggs. Dried beans, peas, or lentils. Unsalted nuts and seeds. Unsalted canned beans. Dairy Low-fat dairy products, such as skim or 1% milk, 2% or reduced-fat cheeses, low-fat ricotta or cottage cheese, or plain low-fat yogurt. Low-sodium or reduced-sodium cheeses. Fats and Oils Tub margarines without trans fats. Light or reduced-fat mayonnaise and salad dressings (reduced sodium). Avocado. Safflower, olive, or canola oils. Natural peanut or almond butter. Other Unsalted popcorn and pretzels. The items listed above may not be a complete list of recommended foods or beverages. Contact your dietitian for more options. WHAT FOODS ARE NOT RECOMMENDED? Grains White bread. White pasta. White rice. Refined cornbread. Bagels and croissants. Crackers that contain trans fat. Vegetables Creamed or fried vegetables. Vegetables in a cheese sauce. Regular canned vegetables. Regular canned tomato sauce and paste. Regular tomato and vegetable juices. Fruits Dried fruits. Canned fruit in light or heavy syrup. Fruit juice. Meat and Other Protein Products Fatty cuts of meat. Ribs, chicken wings, bacon, sausage, bologna, salami, chitterlings, fatback, hot dogs, bratwurst, and packaged luncheon meats. Salted nuts and seeds. Canned beans with salt. Dairy Whole or 2% milk, cream, half-and-half, and cream cheese. Whole-fat or sweetened yogurt. Full-fat   cheeses or blue cheese. Nondairy creamers and whipped toppings. Processed cheese, cheese spreads, or cheese  curds. Condiments Onion and garlic salt, seasoned salt, table salt, and sea salt. Canned and packaged gravies. Worcestershire sauce. Tartar sauce. Barbecue sauce. Teriyaki sauce. Soy sauce, including reduced sodium. Steak sauce. Fish sauce. Oyster sauce. Cocktail sauce. Horseradish. Ketchup and mustard. Meat flavorings and tenderizers. Bouillon cubes. Hot sauce. Tabasco sauce. Marinades. Taco seasonings. Relishes. Fats and Oils Butter, stick margarine, lard, shortening, ghee, and bacon fat. Coconut, palm kernel, or palm oils. Regular salad dressings. Other Pickles and olives. Salted popcorn and pretzels. The items listed above may not be a complete list of foods and beverages to avoid. Contact your dietitian for more information. WHERE CAN I FIND MORE INFORMATION? National Heart, Lung, and [employer] Institute: www.nhlbi.nih.gov/health/health-topics/topics/dash/ Document Released: 06/26/2011 Document Revised: 11/21/2013 Document Reviewed: 05/11/2013 [employer] Patient Information 2015 [employer]. This information is not intended to replace advice given to you by your health care provider. Make sure you discuss any questions you have with your health care provider. Hypertension Hypertension, commonly called high blood pressure, is when the force of blood pumping through your arteries is too strong. Your arteries are the blood vessels that carry blood from your heart throughout your body. A blood pressure reading consists of a higher number over a lower number, such as 110/72. The higher number (systolic) is the pressure inside your arteries when your heart pumps. The lower number (diastolic) is the pressure inside your arteries when your heart relaxes. Ideally you want your blood pressure below 120/80. Hypertension forces your heart to work harder to pump blood. Your arteries may become narrow or stiff. Having hypertension puts you at risk for heart disease, stroke, and other problems.  RISK  FACTORS Some risk factors for high blood pressure are controllable. Others are not.  Risk factors you cannot control include:   Race. You may be at higher risk if you are African American.  Age. Risk increases with age.  Gender. Men are at higher risk than women before age 45 years. After age 65, women are at higher risk than men. Risk factors you can control include:  Not getting enough exercise or physical activity.  Being overweight.  Getting too much fat, sugar, calories, or salt in your diet.  Drinking too much alcohol. SIGNS AND SYMPTOMS Hypertension does not usually cause signs or symptoms. Extremely high blood pressure (hypertensive crisis) may cause headache, anxiety, shortness of breath, and nosebleed. DIAGNOSIS  To check if you have hypertension, your health care provider will measure your blood pressure while you are seated, with your arm held at the level of your heart. It should be measured at least twice using the same arm. Certain conditions can cause a difference in blood pressure between your right and left arms. A blood pressure reading that is higher than normal on one occasion does not mean that you need treatment. If one blood pressure reading is high, ask your health care provider about having it checked again. TREATMENT  Treating high blood pressure includes making lifestyle changes and possibly taking medicine. Living a healthy lifestyle can help lower high blood pressure. You may need to change some of your habits. Lifestyle changes may include:  Following the DASH diet. This diet is high in fruits, vegetables, and whole grains. It is low in salt, red meat, and added sugars.  Getting at least 2 hours of brisk physical activity every week.  Losing weight if necessary.  Not smoking.  Limiting   alcoholic beverages.  Learning ways to reduce stress. If lifestyle changes are not enough to get your blood pressure under control, your health care provider may  prescribe medicine. You may need to take more than one. Work closely with your health care provider to understand the risks and benefits. HOME CARE INSTRUCTIONS  Have your blood pressure rechecked as directed by your health care provider.   Take medicines only as directed by your health care provider. Follow the directions carefully. Blood pressure medicines must be taken as prescribed. The medicine does not work as well when you skip doses. Skipping doses also puts you at risk for problems.   Do not smoke.   Monitor your blood pressure at home as directed by your health care provider. SEEK MEDICAL CARE IF:   You think you are having a reaction to medicines taken.  You have recurrent headaches or feel dizzy.  You have swelling in your ankles.  You have trouble with your vision. SEEK IMMEDIATE MEDICAL CARE IF:  You develop a severe headache or confusion.  You have unusual weakness, numbness, or feel faint.  You have severe chest or abdominal pain.  You vomit repeatedly.  You have trouble breathing. MAKE SURE YOU:   Understand these instructions.  Will watch your condition.  Will get help right away if you are not doing well or get worse. Document Released: 07/07/2005 Document Revised: 11/21/2013 Document Reviewed: 04/29/2013 Exit[employer] Patient Information 2015 [employer]. This information is not intended to replace advice given to you by your health care provider. Make sure you discuss any questions you have with your health care provider. Sciatica Sciatica is pain, weakness, numbness, or tingling along the path of the sciatic nerve. The nerve starts in the lower back and runs down the back of each leg. The nerve controls the muscles in the lower leg and in the back of the knee, while also providing sensation to the back of the thigh, lower leg, and the sole of your foot. Sciatica is a symptom of another medical condition. For instance, nerve damage or certain  conditions, such as a herniated disk or bone spur on the spine, pinch or put pressure on the sciatic nerve. This causes the pain, weakness, or other sensations normally associated with sciatica. Generally, sciatica only affects one side of the body. CAUSES   Herniated or slipped disc.  Degenerative disk disease.  A pain disorder involving the narrow muscle in the buttocks (piriformis syndrome).  Pelvic injury or fracture.  Pregnancy.  Tumor (rare). SYMPTOMS  Symptoms can vary from mild to very severe. The symptoms usually travel from the low back to the buttocks and down the back of the leg. Symptoms can include:  Mild tingling or dull aches in the lower back, leg, or hip.  Numbness in the back of the calf or sole of the foot.  Burning sensations in the lower back, leg, or hip.  Sharp pains in the lower back, leg, or hip.  Leg weakness.  Severe back pain inhibiting movement. These symptoms may get worse with coughing, sneezing, laughing, or prolonged sitting or standing. Also, being overweight may worsen symptoms. DIAGNOSIS  Your caregiver will perform a physical exam to look for common symptoms of sciatica. He or she may ask you to do certain movements or activities that would trigger sciatic nerve pain. Other tests may be performed to find the cause of the sciatica. These may include:  Blood tests.  X-rays.  Imaging tests, such as an MRI   or CT scan. TREATMENT  Treatment is directed at the cause of the sciatic pain. Sometimes, treatment is not necessary and the pain and discomfort goes away on its own. If treatment is needed, your caregiver may suggest:  Over-the-counter medicines to relieve pain.  Prescription medicines, such as anti-inflammatory medicine, muscle relaxants, or narcotics.  Applying heat or ice to the painful area.  Steroid injections to lessen pain, irritation, and inflammation around the nerve.  Reducing activity during periods of pain.  Exercising  and stretching to strengthen your abdomen and improve flexibility of your spine. Your caregiver may suggest losing weight if the extra weight makes the back pain worse.  Physical therapy.  Surgery to eliminate what is pressing or pinching the nerve, such as a bone spur or part of a herniated disk. HOME CARE INSTRUCTIONS   Only take over-the-counter or prescription medicines for pain or discomfort as directed by your caregiver.  Apply ice to the affected area for 20 minutes, 3-4 times a day for the first 48-72 hours. Then try heat in the same way.  Exercise, stretch, or perform your usual activities if these do not aggravate your pain.  Attend physical therapy sessions as directed by your caregiver.  Keep all follow-up appointments as directed by your caregiver.  Do not wear high heels or shoes that do not provide proper support.  Check your mattress to see if it is too soft. A firm mattress may lessen your pain and discomfort. SEEK IMMEDIATE MEDICAL CARE IF:   You lose control of your bowel or bladder (incontinence).  You have increasing weakness in the lower back, pelvis, buttocks, or legs.  You have redness or swelling of your back.  You have a burning sensation when you urinate.  You have pain that gets worse when you lie down or awakens you at night.  Your pain is worse than you have experienced in the past.  Your pain is lasting longer than 4 weeks.  You are suddenly losing weight without reason. MAKE SURE YOU:  Understand these instructions.  Will watch your condition.  Will get help right away if you are not doing well or get worse. Document Released: 07/01/2001 Document Revised: 01/06/2012 Document Reviewed: 11/16/2011 Exit[employer] Patient Information 2015 [employer]. This information is not intended to replace advice given to you by your health care provider. Make sure you discuss any questions you have with your health care provider.  

## 2015-02-07 LAB — BMP8+EGFR
BUN/Creatinine Ratio: 16 (ref 9–20)
BUN: 14 mg/dL (ref 6–24)
CO2: 25 mmol/L (ref 18–29)
Calcium: 10.1 mg/dL (ref 8.7–10.2)
Chloride: 98 mmol/L (ref 97–108)
Creatinine, Ser: 0.9 mg/dL (ref 0.76–1.27)
GFR calc Af Amer: 112 mL/min/{1.73_m2} (ref >59)
GFR calc non Af Amer: 97 mL/min/{1.73_m2} (ref >59)
Glucose: 90 mg/dL (ref 65–99)
Potassium: 4.7 mmol/L (ref 3.5–5.2)
Sodium: 143 mmol/L (ref 134–144)

## 2015-02-08 ENCOUNTER — Telehealth: Payer: Self-pay | Admitting: *Deleted

## 2015-02-08 NOTE — Telephone Encounter (Signed)
Pt notified of results

## 2015-02-08 NOTE — Telephone Encounter (Signed)
-----   Message from Junie Spencer, FNP sent at 02/07/2015 10:26 AM EDT ----- Kidney  function stable

## 2015-02-20 ENCOUNTER — Ambulatory Visit (INDEPENDENT_AMBULATORY_CARE_PROVIDER_SITE_OTHER): Payer: BLUE CROSS/BLUE SHIELD | Admitting: Family

## 2015-02-20 ENCOUNTER — Encounter: Payer: Self-pay | Admitting: Family

## 2015-02-20 VITALS — BP 139/100 | HR 94 | Temp 96.8°F | Ht 78.0 in | Wt 228.4 lb

## 2015-02-20 DIAGNOSIS — Z13220 Encounter for screening for lipoid disorders: Secondary | ICD-10-CM | POA: Diagnosis not present

## 2015-02-20 DIAGNOSIS — I10 Essential (primary) hypertension: Secondary | ICD-10-CM | POA: Insufficient documentation

## 2015-02-20 MED ORDER — LISINOPRIL-HYDROCHLOROTHIAZIDE 20-12.5 MG PO TABS: 2.0000 | ORAL_TABLET | Freq: Every day | ORAL | Status: DC

## 2015-02-20 NOTE — Patient Instructions (Signed)
DASH Eating Plan DASH stands for "Dietary Approaches to Stop Hypertension." The DASH eating plan is a healthy eating plan that has been shown to reduce high blood pressure (hypertension). Additional health benefits may include reducing the risk of type 2 diabetes mellitus, heart disease, and stroke. The DASH eating plan may also help with weight loss. WHAT DO I NEED TO KNOW ABOUT THE DASH EATING PLAN? For the DASH eating plan, you will follow these general guidelines:  Choose foods with a percent daily value for sodium of less than 5% (as listed on the food label).  Use salt-free seasonings or herbs instead of table salt or sea salt.  Check with your health care provider or pharmacist before using salt substitutes.  Eat lower-sodium products, often labeled as "lower sodium" or "no salt added."  Eat fresh foods.  Eat more vegetables, fruits, and low-fat dairy products.  Choose whole grains. Look for the word "whole" as the first word in the ingredient list.  Choose fish and skinless chicken or turkey more often than red meat. Limit fish, poultry, and meat to 6 oz (170 g) each day.  Limit sweets, desserts, sugars, and sugary drinks.  Choose heart-healthy fats.  Limit cheese to 1 oz (28 g) per day.  Eat more home-cooked food and less restaurant, buffet, and fast food.  Limit fried foods.  Cook foods using methods other than frying.  Limit canned vegetables. If you do use them, rinse them well to decrease the sodium.  When eating at a restaurant, ask that your food be prepared with less salt, or no salt if possible. WHAT FOODS CAN I EAT? Seek help from a dietitian for individual calorie needs. Grains Whole grain or whole wheat bread. Brown rice. Whole grain or whole wheat pasta. Quinoa, bulgur, and whole grain cereals. Low-sodium cereals. Corn or whole wheat flour tortillas. Whole grain cornbread. Whole grain crackers. Low-sodium crackers. Vegetables Fresh or frozen vegetables  (raw, steamed, roasted, or grilled). Low-sodium or reduced-sodium tomato and vegetable juices. Low-sodium or reduced-sodium tomato sauce and paste. Low-sodium or reduced-sodium canned vegetables.  Fruits All fresh, canned (in natural juice), or frozen fruits. Meat and Other Protein Products Ground beef (85% or leaner), grass-fed beef, or beef trimmed of fat. Skinless chicken or turkey. Ground chicken or turkey. Pork trimmed of fat. All fish and seafood. Eggs. Dried beans, peas, or lentils. Unsalted nuts and seeds. Unsalted canned beans. Dairy Low-fat dairy products, such as skim or 1% milk, 2% or reduced-fat cheeses, low-fat ricotta or cottage cheese, or plain low-fat yogurt. Low-sodium or reduced-sodium cheeses. Fats and Oils Tub margarines without trans fats. Light or reduced-fat mayonnaise and salad dressings (reduced sodium). Avocado. Safflower, olive, or canola oils. Natural peanut or almond butter. Other Unsalted popcorn and pretzels. The items listed above may not be a complete list of recommended foods or beverages. Contact your dietitian for more options. WHAT FOODS ARE NOT RECOMMENDED? Grains White bread. White pasta. White rice. Refined cornbread. Bagels and croissants. Crackers that contain trans fat. Vegetables Creamed or fried vegetables. Vegetables in a cheese sauce. Regular canned vegetables. Regular canned tomato sauce and paste. Regular tomato and vegetable juices. Fruits Dried fruits. Canned fruit in light or heavy syrup. Fruit juice. Meat and Other Protein Products Fatty cuts of meat. Ribs, chicken wings, bacon, sausage, bologna, salami, chitterlings, fatback, hot dogs, bratwurst, and packaged luncheon meats. Salted nuts and seeds. Canned beans with salt. Dairy Whole or 2% milk, cream, half-and-half, and cream cheese. Whole-fat or sweetened yogurt. Full-fat   cheeses or blue cheese. Nondairy creamers and whipped toppings. Processed cheese, cheese spreads, or cheese  curds. Condiments Onion and garlic salt, seasoned salt, table salt, and sea salt. Canned and packaged gravies. Worcestershire sauce. Tartar sauce. Barbecue sauce. Teriyaki sauce. Soy sauce, including reduced sodium. Steak sauce. Fish sauce. Oyster sauce. Cocktail sauce. Horseradish. Ketchup and mustard. Meat flavorings and tenderizers. Bouillon cubes. Hot sauce. Tabasco sauce. Marinades. Taco seasonings. Relishes. Fats and Oils Butter, stick margarine, lard, shortening, ghee, and bacon fat. Coconut, palm kernel, or palm oils. Regular salad dressings. Other Pickles and olives. Salted popcorn and pretzels. The items listed above may not be a complete list of foods and beverages to avoid. Contact your dietitian for more information. WHERE CAN I FIND MORE INFORMATION? National Heart, Lung, and Blood Institute: www.nhlbi.nih.gov/health/health-topics/topics/dash/ Document Released: 06/26/2011 Document Revised: 11/21/2013 Document Reviewed: 05/11/2013 [employer] Patient Information 2015 [employer]. This information is not intended to replace advice given to you by your health care provider. Make sure you discuss any questions you have with your health care provider. Hypertension Hypertension, commonly called high blood pressure, is when the force of blood pumping through your arteries is too strong. Your arteries are the blood vessels that carry blood from your heart throughout your body. A blood pressure reading consists of a higher number over a lower number, such as 110/72. The higher number (systolic) is the pressure inside your arteries when your heart pumps. The lower number (diastolic) is the pressure inside your arteries when your heart relaxes. Ideally you want your blood pressure below 120/80. Hypertension forces your heart to work harder to pump blood. Your arteries may become narrow or stiff. Having hypertension puts you at risk for heart disease, stroke, and other problems.  RISK  FACTORS Some risk factors for high blood pressure are controllable. Others are not.  Risk factors you cannot control include:   Race. You may be at higher risk if you are African American.  Age. Risk increases with age.  Gender. Men are at higher risk than women before age 45 years. After age 65, women are at higher risk than men. Risk factors you can control include:  Not getting enough exercise or physical activity.  Being overweight.  Getting too much fat, sugar, calories, or salt in your diet.  Drinking too much alcohol. SIGNS AND SYMPTOMS Hypertension does not usually cause signs or symptoms. Extremely high blood pressure (hypertensive crisis) may cause headache, anxiety, shortness of breath, and nosebleed. DIAGNOSIS  To check if you have hypertension, your health care provider will measure your blood pressure while you are seated, with your arm held at the level of your heart. It should be measured at least twice using the same arm. Certain conditions can cause a difference in blood pressure between your right and left arms. A blood pressure reading that is higher than normal on one occasion does not mean that you need treatment. If one blood pressure reading is high, ask your health care provider about having it checked again. TREATMENT  Treating high blood pressure includes making lifestyle changes and possibly taking medicine. Living a healthy lifestyle can help lower high blood pressure. You may need to change some of your habits. Lifestyle changes may include:  Following the DASH diet. This diet is high in fruits, vegetables, and whole grains. It is low in salt, red meat, and added sugars.  Getting at least 2 hours of brisk physical activity every week.  Losing weight if necessary.  Not smoking.  Limiting   alcoholic beverages.  Learning ways to reduce stress. If lifestyle changes are not enough to get your blood pressure under control, your health care provider may  prescribe medicine. You may need to take more than one. Work closely with your health care provider to understand the risks and benefits. HOME CARE INSTRUCTIONS  Have your blood pressure rechecked as directed by your health care provider.   Take medicines only as directed by your health care provider. Follow the directions carefully. Blood pressure medicines must be taken as prescribed. The medicine does not work as well when you skip doses. Skipping doses also puts you at risk for problems.   Do not smoke.   Monitor your blood pressure at home as directed by your health care provider. SEEK MEDICAL CARE IF:   You think you are having a reaction to medicines taken.  You have recurrent headaches or feel dizzy.  You have swelling in your ankles.  You have trouble with your vision. SEEK IMMEDIATE MEDICAL CARE IF:  You develop a severe headache or confusion.  You have unusual weakness, numbness, or feel faint.  You have severe chest or abdominal pain.  You vomit repeatedly.  You have trouble breathing. MAKE SURE YOU:   Understand these instructions.  Will watch your condition.  Will get help right away if you are not doing well or get worse. Document Released: 07/07/2005 Document Revised: 11/21/2013 Document Reviewed: 04/29/2013 [employer] Patient Information 2015 [employer]. This information is not intended to replace advice given to you by your health care provider. Make sure you discuss any questions you have with your health care provider.  

## 2015-02-20 NOTE — Progress Notes (Signed)
Subjective:    Patient ID: Luis Ford, male    DOB: 11/20/1960, 53 y.o.   MRN: [id number]  Pt presents to the office today to recheck HTN. Pt's BP is not at goal. Hypertension This is a chronic problem. The current episode started more than 1 year ago. The problem has been waxing and waning since onset. The problem is uncontrolled. Pertinent negatives include no anxiety, headaches, palpitations, peripheral edema or shortness of breath. Risk factors for coronary artery disease include obesity, male gender, family history and sedentary lifestyle. Past treatments include diuretics and ACE inhibitors. The current treatment provides mild improvement.      Review of Systems  Constitutional: Negative.   HENT: Negative.   Respiratory: Negative.  Negative for shortness of breath.   Cardiovascular: Negative.  Negative for palpitations.  Gastrointestinal: Negative.   Endocrine: Negative.   Genitourinary: Negative.   Musculoskeletal: Negative.   Neurological: Negative.  Negative for headaches.  Hematological: Negative.   Psychiatric/Behavioral: Negative.   All other systems reviewed and are negative.      Objective:   Physical Exam  Constitutional: He is oriented to person, place, and time. He appears well-developed and well-nourished. No distress.  HENT:  Head: Normocephalic.  Right Ear: External ear normal.  Left Ear: External ear normal.  Nose: Nose normal.  Mouth/Throat: Oropharynx is clear and moist.  Eyes: Pupils are equal, round, and reactive to light. Right eye exhibits no discharge. Left eye exhibits no discharge.  Neck: Normal range of motion. Neck supple. No thyromegaly present.  Cardiovascular: Normal rate, regular rhythm, normal heart sounds and intact distal pulses.   No murmur heard. Pulmonary/Chest: Effort normal and breath sounds normal. No respiratory distress. He has no wheezes.  Abdominal: Soft. Bowel sounds are normal. He exhibits no distension. There is no  tenderness.  Musculoskeletal: Normal range of motion. He exhibits no edema or tenderness.  Neurological: He is alert and oriented to person, place, and time. He has normal reflexes. No cranial nerve deficit.  Skin: Skin is warm and dry. No rash noted. No erythema.  Psychiatric: He has a normal mood and affect. His behavior is normal. Judgment and thought content normal.  Vitals reviewed.   BP 139/100 mmHg  Pulse 94  Temp(Src) 96.8 F (36 C) (Oral)  Ht 6' 6" (1.981 m)  Wt 228 lb 6.4 oz (103.602 kg)  BMI 26.40 kg/m2       Assessment & Plan:  1. Essential hypertension -Pt's Zestoretic increased to 2 tabs daily (40mg/25 mg daily) -Daily blood pressure log given with instructions on how to fill out and told to bring to next visit -Dash diet information given -Exercise encouraged - Stress Management  -Continue current meds -RTO in 2 weeks - BMP8+EGFR - lisinopril-hydrochlorothiazide (ZESTORETIC) 20-12.5 MG per tablet; Take 2 tablets by mouth daily.  Dispense: 270 tablet; Refill: 3  2. Screening cholesterol level - Lipid panel - BMP8+EGFR  Christy Hawks, FNP  

## 2015-02-21 ENCOUNTER — Telehealth: Payer: Self-pay | Admitting: Family

## 2015-02-21 ENCOUNTER — Other Ambulatory Visit: Payer: Self-pay | Admitting: Family

## 2015-02-21 ENCOUNTER — Ambulatory Visit: Payer: BLUE CROSS/BLUE SHIELD | Admitting: *Deleted

## 2015-02-21 DIAGNOSIS — Z0130 Encounter for examination of blood pressure without abnormal findings: Secondary | ICD-10-CM

## 2015-02-21 LAB — LIPID PANEL
CHOLESTEROL TOTAL: 227 mg/dL — AB (ref 100–199)
Chol/HDL Ratio: 5.2 ratio units — ABNORMAL HIGH (ref 0.0–5.0)
HDL: 44 mg/dL (ref >39)
LDL CALC: 152 mg/dL — AB (ref 0–99)
TRIGLYCERIDES: 154 mg/dL — AB (ref 0–149)
VLDL Cholesterol Cal: 31 mg/dL (ref 5–40)

## 2015-02-21 LAB — BMP8+EGFR
BUN / CREAT RATIO: 20 (ref 9–20)
BUN: 23 mg/dL (ref 6–24)
CO2: 24 mmol/L (ref 18–29)
CREATININE: 1.16 mg/dL (ref 0.76–1.27)
Calcium: 10.1 mg/dL (ref 8.7–10.2)
Chloride: 95 mmol/L — ABNORMAL LOW (ref 97–108)
GFR calc Af Amer: 83 mL/min/{1.73_m2} (ref >59)
GFR calc non Af Amer: 71 mL/min/{1.73_m2} (ref >59)
GLUCOSE: 96 mg/dL (ref 65–99)
Potassium: 4.9 mmol/L (ref 3.5–5.2)
SODIUM: 137 mmol/L (ref 134–144)

## 2015-02-21 MED ORDER — SIMVASTATIN 40 MG PO TABS: 40.0000 mg | ORAL_TABLET | Freq: Every day | ORAL | Status: DC

## 2015-02-21 NOTE — Progress Notes (Signed)
Patient in today stating he is weak and tired after starting new BP medication.  BP 144/80 P-68.  Per Jannifer Rodney, FNP, patient is to try taking BP medication in the evening instead of in the morning.  If not any better give [name] a call.  Patient verbalized understanding.

## 2015-03-06 ENCOUNTER — Ambulatory Visit: Payer: BLUE CROSS/BLUE SHIELD | Admitting: Family

## 2015-05-21 ENCOUNTER — Telehealth: Payer: Self-pay | Admitting: Family

## 2015-09-07 ENCOUNTER — Ambulatory Visit (INDEPENDENT_AMBULATORY_CARE_PROVIDER_SITE_OTHER): Payer: BLUE CROSS/BLUE SHIELD | Admitting: Family

## 2015-09-07 ENCOUNTER — Encounter: Payer: Self-pay | Admitting: Family

## 2015-09-07 VITALS — BP 144/88 | HR 86 | Temp 97.4°F | Ht 78.0 in | Wt 232.2 lb

## 2015-09-07 DIAGNOSIS — F558 Abuse of other non-psychoactive substances: Secondary | ICD-10-CM

## 2015-09-07 DIAGNOSIS — M5442 Lumbago with sciatica, left side: Secondary | ICD-10-CM

## 2015-09-07 DIAGNOSIS — R748 Abnormal levels of other serum enzymes: Secondary | ICD-10-CM

## 2015-09-07 DIAGNOSIS — F1990 Other psychoactive substance use, unspecified, uncomplicated: Secondary | ICD-10-CM

## 2015-09-07 DIAGNOSIS — R7989 Other specified abnormal findings of blood chemistry: Secondary | ICD-10-CM

## 2015-09-07 MED ORDER — METHYLPREDNISOLONE ACETATE 80 MG/ML IJ SUSP
80.0000 mg | Freq: Once | INTRAMUSCULAR | Status: AC
  Administered 2015-09-07: 80 mg via INTRAMUSCULAR

## 2015-09-07 MED ORDER — TRAMADOL HCL 50 MG PO TABS: 50.0000 mg | ORAL_TABLET | Freq: Three times a day (TID) | ORAL | Status: DC | PRN

## 2015-09-07 MED ORDER — GABAPENTIN 100 MG PO CAPS: 100.0000 mg | ORAL_CAPSULE | Freq: Three times a day (TID) | ORAL | Status: DC

## 2015-09-07 MED ORDER — PREDNISONE 10 MG (21) PO TBPK: 10.0000 mg | ORAL_TABLET | Freq: Every day | ORAL | Status: DC

## 2015-09-07 NOTE — Patient Instructions (Signed)
Sciatica With Rehab The sciatic nerve runs from the back down the leg and is responsible for sensation and control of the muscles in the back (posterior) side of the thigh, lower leg, and foot. Sciatica is a condition that is characterized by inflammation of this nerve.  SYMPTOMS   Signs of nerve damage, including numbness and/or weakness along the posterior side of the lower extremity.  Pain in the back of the thigh that may also travel down the leg.  Pain that worsens when sitting for long periods of time.  Occasionally, pain in the back or buttock. CAUSES  Inflammation of the sciatic nerve is the cause of sciatica. The inflammation is due to something irritating the nerve. Common sources of irritation include:  Sitting for long periods of time.  Direct trauma to the nerve.  Arthritis of the spine.  Herniated or ruptured disk.  Slipping of the vertebrae (spondylolisthesis).  Pressure from soft tissues, such as muscles or ligament-like tissue (fascia). RISK INCREASES WITH:  Sports that place pressure or stress on the spine (football or weightlifting).  Poor strength and flexibility.  Failure to warm up properly before activity.  Family history of low back pain or disk disorders.  Previous back injury or surgery.  Poor body mechanics, especially when lifting, or poor posture. PREVENTION   Warm up and stretch properly before activity.  Maintain physical fitness:  Strength, flexibility, and endurance.  Cardiovascular fitness.  Learn and use proper technique, especially with posture and lifting. When possible, have coach correct improper technique.  Avoid activities that place stress on the spine. PROGNOSIS If treated properly, then sciatica usually resolves within 6 weeks. However, occasionally surgery is necessary.  RELATED COMPLICATIONS   Permanent nerve damage, including pain, numbness, tingle, or weakness.  Chronic back pain.  Risks of surgery: infection,  bleeding, nerve damage, or damage to surrounding tissues. TREATMENT Treatment initially involves resting from any activities that aggravate your symptoms. The use of ice and medication may help reduce pain and inflammation. The use of strengthening and stretching exercises may help reduce pain with activity. These exercises may be performed at home or with referral to a therapist. A therapist may recommend further treatments, such as transcutaneous electronic nerve stimulation (TENS) or ultrasound. Your caregiver may recommend corticosteroid injections to help reduce inflammation of the sciatic nerve. If symptoms persist despite non-surgical (conservative) treatment, then surgery may be recommended. MEDICATION  If pain medication is necessary, then nonsteroidal anti-inflammatory medications, such as aspirin and ibuprofen, or other minor pain relievers, such as acetaminophen, are often recommended.  Do not take pain medication for 7 days before surgery.  Prescription pain relievers may be given if deemed necessary by your caregiver. Use only as directed and only as much as you need.  Ointments applied to the skin may be helpful.  Corticosteroid injections may be given by your caregiver. These injections should be reserved for the most serious cases, because they may only be given a certain number of times. HEAT AND COLD  Cold treatment (icing) relieves pain and reduces inflammation. Cold treatment should be applied for 10 to 15 minutes every 2 to 3 hours for inflammation and pain and immediately after any activity that aggravates your symptoms. Use ice packs or massage the area with a piece of ice (ice massage).  Heat treatment may be used prior to performing the stretching and strengthening activities prescribed by your caregiver, physical therapist, or athletic trainer. Use a heat pack or soak the injury in warm water.   SEEK MEDICAL CARE IF:  Treatment seems to offer no benefit, or the condition  worsens.  Any medications produce adverse side effects. EXERCISES  RANGE OF MOTION (ROM) AND STRETCHING EXERCISES - Sciatica Most people with sciatic will find that their symptoms worsen with either excessive bending forward (flexion) or arching at the low back (extension). The exercises which will help resolve your symptoms will focus on the opposite motion. Your physician, physical therapist or athletic trainer will help you determine which exercises will be most helpful to resolve your low back pain. Do not complete any exercises without first consulting with your clinician. Discontinue any exercises which worsen your symptoms until you speak to your clinician. If you have pain, numbness or tingling which travels down into your buttocks, leg or foot, the goal of the therapy is for these symptoms to move closer to your back and eventually resolve. Occasionally, these leg symptoms will get better, but your low back pain may worsen; this is typically an indication of progress in your rehabilitation. Be certain to be very alert to any changes in your symptoms and the activities in which you participated in the 24 hours prior to the change. Sharing this information with your clinician will allow him/her to most efficiently treat your condition. These exercises may help you when beginning to rehabilitate your injury. Your symptoms may resolve with or without further involvement from your physician, physical therapist or athletic trainer. While completing these exercises, remember:   Restoring tissue flexibility helps normal motion to return to the joints. This allows healthier, less painful movement and activity.  An effective stretch should be held for at least 30 seconds.  A stretch should never be painful. You should only feel a gentle lengthening or release in the stretched tissue. FLEXION RANGE OF MOTION AND STRETCHING EXERCISES: STRETCH - Flexion, Single Knee to Chest   Lie on a firm bed or floor  with both legs extended in front of you.  Keeping one leg in contact with the floor, bring your opposite knee to your chest. Hold your leg in place by either grabbing behind your thigh or at your knee.  Pull until you feel a gentle stretch in your low back. Hold __________ seconds.  Slowly release your grasp and repeat the exercise with the opposite side. Repeat __________ times. Complete this exercise __________ times per day.  STRETCH - Flexion, Double Knee to Chest  Lie on a firm bed or floor with both legs extended in front of you.  Keeping one leg in contact with the floor, bring your opposite knee to your chest.  Tense your stomach muscles to support your back and then lift your other knee to your chest. Hold your legs in place by either grabbing behind your thighs or at your knees.  Pull both knees toward your chest until you feel a gentle stretch in your low back. Hold __________ seconds.  Tense your stomach muscles and slowly return one leg at a time to the floor. Repeat __________ times. Complete this exercise __________ times per day.  STRETCH - Low Trunk Rotation   Lie on a firm bed or floor. Keeping your legs in front of you, bend your knees so they are both pointed toward the ceiling and your feet are flat on the floor.  Extend your arms out to the side. This will stabilize your upper body by keeping your shoulders in contact with the floor.  Gently and slowly drop both knees together to one side until   you feel a gentle stretch in your low back. Hold for __________ seconds.  Tense your stomach muscles to support your low back as you bring your knees back to the starting position. Repeat the exercise to the other side. Repeat __________ times. Complete this exercise __________ times per day  EXTENSION RANGE OF MOTION AND FLEXIBILITY EXERCISES: STRETCH - Extension, Prone on Elbows  Lie on your stomach on the floor, a bed will be too soft. Place your palms about shoulder  width apart and at the height of your head.  Place your elbows under your shoulders. If this is too painful, stack pillows under your chest.  Allow your body to relax so that your hips drop lower and make contact more completely with the floor.  Hold this position for __________ seconds.  Slowly return to lying flat on the floor. Repeat __________ times. Complete this exercise __________ times per day.  RANGE OF MOTION - Extension, Prone Press Ups  Lie on your stomach on the floor, a bed will be too soft. Place your palms about shoulder width apart and at the height of your head.  Keeping your back as relaxed as possible, slowly straighten your elbows while keeping your hips on the floor. You may adjust the placement of your hands to maximize your comfort. As you gain motion, your hands will come more underneath your shoulders.  Hold this position __________ seconds.  Slowly return to lying flat on the floor. Repeat __________ times. Complete this exercise __________ times per day.  STRENGTHENING EXERCISES - Sciatica  These exercises may help you when beginning to rehabilitate your injury. These exercises should be done near your "sweet spot." This is the neutral, low-back arch, somewhere between fully rounded and fully arched, that is your least painful position. When performed in this safe range of motion, these exercises can be used for people who have either a flexion or extension based injury. These exercises may resolve your symptoms with or without further involvement from your physician, physical therapist or athletic trainer. While completing these exercises, remember:   Muscles can gain both the endurance and the strength needed for everyday activities through controlled exercises.  Complete these exercises as instructed by your physician, physical therapist or athletic trainer. Progress with the resistance and repetition exercises only as your caregiver advises.  You may  experience muscle soreness or fatigue, but the pain or discomfort you are trying to eliminate should never worsen during these exercises. If this pain does worsen, stop and make certain you are following the directions exactly. If the pain is still present after adjustments, discontinue the exercise until you can discuss the trouble with your clinician. STRENGTHENING - Deep Abdominals, Pelvic Tilt   Lie on a firm bed or floor. Keeping your legs in front of you, bend your knees so they are both pointed toward the ceiling and your feet are flat on the floor.  Tense your lower abdominal muscles to press your low back into the floor. This motion will rotate your pelvis so that your tail bone is scooping upwards rather than pointing at your feet or into the floor.  With a gentle tension and even breathing, hold this position for __________ seconds. Repeat __________ times. Complete this exercise __________ times per day.  STRENGTHENING - Abdominals, Crunches   Lie on a firm bed or floor. Keeping your legs in front of you, bend your knees so they are both pointed toward the ceiling and your feet are flat on the   floor. Cross your arms over your chest.  Slightly tip your chin down without bending your neck.  Tense your abdominals and slowly lift your trunk high enough to just clear your shoulder blades. Lifting higher can put excessive stress on the low back and does not further strengthen your abdominal muscles.  Control your return to the starting position. Repeat __________ times. Complete this exercise __________ times per day.  STRENGTHENING - Quadruped, Opposite UE/LE Lift  Assume a hands and knees position on a firm surface. Keep your hands under your shoulders and your knees under your hips. You may place padding under your knees for comfort.  Find your neutral spine and gently tense your abdominal muscles so that you can maintain this position. Your shoulders and hips should form a rectangle  that is parallel with the floor and is not twisted.  Keeping your trunk steady, lift your right hand no higher than your shoulder and then your left leg no higher than your hip. Make sure you are not holding your breath. Hold this position __________ seconds.  Continuing to keep your abdominal muscles tense and your back steady, slowly return to your starting position. Repeat with the opposite arm and leg. Repeat __________ times. Complete this exercise __________ times per day.  STRENGTHENING - Abdominals and Quadriceps, Straight Leg Raise   Lie on a firm bed or floor with both legs extended in front of you.  Keeping one leg in contact with the floor, bend the other knee so that your foot can rest flat on the floor.  Find your neutral spine, and tense your abdominal muscles to maintain your spinal position throughout the exercise.  Slowly lift your straight leg off the floor about 6 inches for a count of 15, making sure to not hold your breath.  Still keeping your neutral spine, slowly lower your leg all the way to the floor. Repeat this exercise with each leg __________ times. Complete this exercise __________ times per day. POSTURE AND BODY MECHANICS CONSIDERATIONS - Sciatica Keeping correct posture when sitting, standing or completing your activities will reduce the stress put on different body tissues, allowing injured tissues a chance to heal and limiting painful experiences. The following are general guidelines for improved posture. Your physician or physical therapist will provide you with any instructions specific to your needs. While reading these guidelines, remember:  The exercises prescribed by your provider will help you have the flexibility and strength to maintain correct postures.  The correct posture provides the optimal environment for your joints to work. All of your joints have less wear and tear when properly supported by a spine with good posture. This means you will  experience a healthier, less painful body.  Correct posture must be practiced with all of your activities, especially prolonged sitting and standing. Correct posture is as important when doing repetitive low-stress activities (typing) as it is when doing a single heavy-load activity (lifting). RESTING POSITIONS Consider which positions are most painful for you when choosing a resting position. If you have pain with flexion-based activities (sitting, bending, stooping, squatting), choose a position that allows you to rest in a less flexed posture. You would want to avoid curling into a fetal position on your side. If your pain worsens with extension-based activities (prolonged standing, working overhead), avoid resting in an extended position such as sleeping on your stomach. Most people will find more comfort when they rest with their spine in a more neutral position, neither too rounded nor too   arched. Lying on a non-sagging bed on your side with a pillow between your knees, or on your back with a pillow under your knees will often provide some relief. Keep in mind, being in any one position for a prolonged period of time, no matter how correct your posture, can still lead to stiffness. PROPER SITTING POSTURE In order to minimize stress and discomfort on your spine, you must sit with correct posture Sitting with good posture should be effortless for a healthy body. Returning to good posture is a gradual process. Many people can work toward this most comfortably by using various supports until they have the flexibility and strength to maintain this posture on their own. When sitting with proper posture, your ears will fall over your shoulders and your shoulders will fall over your hips. You should use the back of the chair to support your upper back. Your low back will be in a neutral position, just slightly arched. You may place a small pillow or folded towel at the base of your low back for support.  When  working at a desk, create an environment that supports good, upright posture. Without extra support, muscles fatigue and lead to excessive strain on joints and other tissues. Keep these recommendations in mind: CHAIR:   A chair should be able to slide under your desk when your back makes contact with the back of the chair. This allows you to work closely.  The chair's height should allow your eyes to be level with the upper part of your monitor and your hands to be slightly lower than your elbows. BODY POSITION  Your feet should make contact with the floor. If this is not possible, use a foot rest.  Keep your ears over your shoulders. This will reduce stress on your neck and low back. INCORRECT SITTING POSTURES   If you are feeling tired and unable to assume a healthy sitting posture, do not slouch or slump. This puts excessive strain on your back tissues, causing more damage and pain. Healthier options include:  Using more support, like a lumbar pillow.  Switching tasks to something that requires you to be upright or walking.  Talking a brief walk.  Lying down to rest in a neutral-spine position. PROLONGED STANDING WHILE SLIGHTLY LEANING FORWARD  When completing a task that requires you to lean forward while standing in one place for a long time, place either foot up on a stationary 2-4 inch high object to help maintain the best posture. When both feet are on the ground, the low back tends to lose its slight inward curve. If this curve flattens (or becomes too large), then the back and your other joints will experience too much stress, fatigue more quickly and can cause pain.  CORRECT STANDING POSTURES Proper standing posture should be assumed with all daily activities, even if they only take a few moments, like when brushing your teeth. As in sitting, your ears should fall over your shoulders and your shoulders should fall over your hips. You should keep a slight tension in your abdominal  muscles to brace your spine. Your tailbone should point down to the ground, not behind your body, resulting in an over-extended swayback posture.  INCORRECT STANDING POSTURES  Common incorrect standing postures include a forward head, locked knees and/or an excessive swayback. WALKING Walk with an upright posture. Your ears, shoulders and hips should all line-up. PROLONGED ACTIVITY IN A FLEXED POSITION When completing a task that requires you to bend forward   at your waist or lean over a low surface, try to find a way to stabilize 3 of 4 of your limbs. You can place a hand or elbow on your thigh or rest a knee on the surface you are reaching across. This will provide you more stability so that your muscles do not fatigue as quickly. By keeping your knees relaxed, or slightly bent, you will also reduce stress across your low back. CORRECT LIFTING TECHNIQUES DO :   Assume a wide stance. This will provide you more stability and the opportunity to get as close as possible to the object which you are lifting.  Tense your abdominals to brace your spine; then bend at the knees and hips. Keeping your back locked in a neutral-spine position, lift using your leg muscles. Lift with your legs, keeping your back straight.  Test the weight of unknown objects before attempting to lift them.  Try to keep your elbows locked down at your sides in order get the best strength from your shoulders when carrying an object.  Always ask for help when lifting heavy or awkward objects. INCORRECT LIFTING TECHNIQUES DO NOT:   Lock your knees when lifting, even if it is a small object.  Bend and twist. Pivot at your feet or move your feet when needing to change directions.  Assume that you cannot safely pick up a paperclip without proper posture.   This information is not intended to replace advice given to you by your health care provider. Make sure you discuss any questions you have with your health care provider.     Document Released: 07/07/2005 Document Revised: 11/21/2014 Document Reviewed: 10/19/2008 [employer] Interactive Patient Education 2016 [employer]  

## 2015-09-07 NOTE — Progress Notes (Signed)
   Subjective:    Patient ID: Luis Ford, male    DOB: 11/22/1960, 55 y.o.   MRN: [id number]   HPI Pt presents to the office today because he was told by the NP at work that "something was wrong with his kidneys". Pt states he had blood work and his "kidney function was not good". Pt reports taking 1200 mg of ibuprofen every 4 hours for back pain with left sciatic pain. PT states the NP told him to stop the ibuprofen, so not he is taking Tylenol 650 mg every 6 hours. PT states he continues to have constant aching pain of 10 out 10 with tingly and numbness.    Review of Systems  Constitutional: Negative.   HENT: Negative.   Respiratory: Negative.   Cardiovascular: Negative.   Gastrointestinal: Negative.   Endocrine: Negative.   Genitourinary: Negative.   Musculoskeletal: Negative.   Neurological: Negative.   Hematological: Negative.   Psychiatric/Behavioral: Negative.   All other systems reviewed and are negative.      Objective:   Physical Exam  Constitutional: He is oriented to person, place, and time. He appears well-developed and well-nourished. No distress.  HENT:  Head: Normocephalic.  Eyes: Pupils are equal, round, and reactive to light. Right eye exhibits no discharge. Left eye exhibits no discharge.  Neck: Normal range of motion. Neck supple. No thyromegaly present.  Cardiovascular: Normal rate, regular rhythm, normal heart sounds and intact distal pulses.   No murmur heard. Pulmonary/Chest: Effort normal and breath sounds normal. No respiratory distress. He has no wheezes.  Abdominal: Soft. Bowel sounds are normal. He exhibits no distension. There is no tenderness.  Musculoskeletal: Normal range of motion. He exhibits no edema or tenderness.  Neurological: He is alert and oriented to person, place, and time. He has normal reflexes. No cranial nerve deficit.  Skin: Skin is warm and dry. No rash noted. No erythema.  Psychiatric: He has a normal mood and affect. His  behavior is normal. Judgment and thought content normal.  Vitals reviewed.     BP 144/88 mmHg  Pulse 86  Temp(Src) 97.4 F (36.3 C) (Oral)  Ht _0  (1.981 m)  Wt 232 lb 3.2 oz (105.325 kg)  BMI 26.84 kg/m2     Assessment & Plan:  1. Left-sided low back pain with left-sided sciatica - CMP14+EGFR - traMADol (ULTRAM) 50 MG tablet; Take 1 tablet (50 mg total) by mouth every 8 (eight) hours as needed.  Dispense: 90 tablet; Refill: 0 - methylPREDNISolone acetate (DEPO-MEDROL) injection 80 mg; Inject 1 mL (80 mg total) into the muscle once. - predniSONE (STERAPRED UNI-PAK 21 TAB) 10 MG (21) TBPK tablet; Take 1 tablet (10 mg total) by mouth daily. As directed x 6 days  Dispense: 21 tablet; Refill: 0 - Ambulatory referral to Physical Therapy - gabapentin (NEURONTIN) 100 MG capsule; Take 1 capsule (100 mg total) by mouth 3 (three) times daily.  Dispense: 90 capsule; Refill: 3  2. Elevated serum creatinine - CMP14+EGFR  3. Excessive use of nonsteroidal anti-inflammatory drug (NSAID) - CMP14+EGFR   Pt told to stop all NSAID's Pt given steroid injection and steroid dose pack to start tomorrow, and started on gabapentin  to help with sciatic  Rest Ice  ROM exercises discussed May need to stop lisinopril depending on kidney function? RTO in 2 weeks  Evelina Dun, FNP

## 2015-09-08 LAB — CMP14+EGFR
ALBUMIN: 4.4 g/dL (ref 3.5–5.5)
ALK PHOS: 83 IU/L (ref 39–117)
ALT: 36 IU/L (ref 0–44)
AST: 24 IU/L (ref 0–40)
Albumin/Globulin Ratio: 1.8 (ref 1.1–2.5)
BUN / CREAT RATIO: 20 (ref 9–20)
BUN: 25 mg/dL — AB (ref 6–24)
Bilirubin Total: 0.2 mg/dL (ref 0.0–1.2)
CO2: 25 mmol/L (ref 18–29)
CREATININE: 1.25 mg/dL (ref 0.76–1.27)
Calcium: 9.5 mg/dL (ref 8.7–10.2)
Chloride: 97 mmol/L (ref 96–106)
GFR calc Af Amer: 75 mL/min/{1.73_m2} (ref >59)
GFR calc non Af Amer: 65 mL/min/{1.73_m2} (ref >59)
GLUCOSE: 91 mg/dL (ref 65–99)
Globulin, Total: 2.4 g/dL (ref 1.5–4.5)
Potassium: 4.7 mmol/L (ref 3.5–5.2)
Sodium: 139 mmol/L (ref 134–144)
Total Protein: 6.8 g/dL (ref 6.0–8.5)

## 2015-09-21 ENCOUNTER — Ambulatory Visit: Payer: BLUE CROSS/BLUE SHIELD | Admitting: Family

## 2015-09-24 ENCOUNTER — Encounter: Payer: Self-pay | Admitting: Family

## 2016-02-20 ENCOUNTER — Telehealth: Payer: Self-pay | Admitting: *Deleted

## 2016-02-20 NOTE — Telephone Encounter (Signed)
Spoke with Clovis Riley NP for patient's employeer [employer]- she states she rechecked patient's labs 01/28/16, and creatinine level was elevated again at 1.48.  She sent labs to [facility] and they were scanned into patient's chart.  She will recheck his labs when he is non fasting next week and evaluate his NSAID use.  She will have him call to schedule a follow up appointment with Jannifer Rodney, FNP if creatinine is still elevated next week.

## 2016-04-14 ENCOUNTER — Ambulatory Visit (INDEPENDENT_AMBULATORY_CARE_PROVIDER_SITE_OTHER): Payer: BLUE CROSS/BLUE SHIELD | Admitting: Physician Assistant

## 2016-04-14 ENCOUNTER — Encounter: Payer: Self-pay | Admitting: Physician Assistant

## 2016-04-14 VITALS — BP 137/88 | HR 77 | Temp 97.7°F | Ht 78.0 in | Wt 237.2 lb

## 2016-04-14 DIAGNOSIS — I10 Essential (primary) hypertension: Secondary | ICD-10-CM

## 2016-04-14 DIAGNOSIS — R748 Abnormal levels of other serum enzymes: Secondary | ICD-10-CM | POA: Diagnosis not present

## 2016-04-14 DIAGNOSIS — E785 Hyperlipidemia, unspecified: Secondary | ICD-10-CM | POA: Insufficient documentation

## 2016-04-14 DIAGNOSIS — R7989 Other specified abnormal findings of blood chemistry: Secondary | ICD-10-CM

## 2016-04-14 LAB — CREATININE, SERUM
Creatinine, Ser: 1.19 mg/dL (ref 0.76–1.27)
GFR calc non Af Amer: 68 mL/min/{1.73_m2} (ref >59)
GFR, EST AFRICAN AMERICAN: 79 mL/min/{1.73_m2} (ref >59)

## 2016-04-14 LAB — BUN: BUN: 19 mg/dL (ref 6–24)

## 2016-04-14 NOTE — Patient Instructions (Signed)
Serum Creatinine Test WHY AM I HAVING THIS TEST? A creatinine test is performed to measure the amount of creatinine in your blood (serum). Creatinine is a waste product of normal muscle activity (contraction). The kidneys filter creatinine from your blood and remove it from your body through urination. Blood creatinine levels stay consistent in people whose muscle mass stays consistent. This level can be increased in people who perform resistance exercise to increase muscle mass. Because creatinine is removed from your body by the kidneys, this test is often used as a way to measure kidney function. Your health care provider may recommend this test if he or she suspects that you have a condition that is negatively affecting your kidney function. This test may also be done as a part of routine blood work used to assess your overall health. WHAT KIND OF SAMPLE IS TAKEN? A blood sample is required for this test. It is usually collected by inserting a needle into a vein or by sticking a finger with a small needle. For children, the blood sample is usually collected by sticking the child's heel with a small needle. HOW DO I PREPARE FOR THE TEST? There is no preparation or fasting required for this test. WHAT ARE THE REFERENCE RANGES? Reference ranges are considered healthy ranges established after testing a large group of healthy people. Reference ranges may vary among different people, labs, and hospitals. It is your responsibility to obtain your test results. Ask the lab or department performing the test when and how you will get your results.   Children or adolescents:  Less than 2 years old: 0.1-0.4 mg/dL.  2-5 years old: 0.2-0.5 mg/dL.  6-9 years old: 0.3-0.6 mg/dL.  10-17 years old: 0.4-1 mg/dL.  Adult male:  18-40 years old: 0.5-1 mg/dL.  41-60 years old: 0.5-1.1 mg/dL.  61 years and older: 0.5-1.2 mg/dL.  Adult male:  18-40 years old: 0.6-1.2 mg/dL.  41-60 years old: 0.6-1.3  mg/dL.  61 years and older: 0.7-1.3 mg/dL. The reference range may be higher in people who perform resistance exercise to increase muscle mass. WHAT DO THE RESULTS MEAN? Abnormally high levels of serum creatinine can be caused by many health conditions. These may include:  Kidney disease.  Urinary tract obstruction.  Lower-than-normal blood flow to the kidneys.  Rhabdomyolysis. This occurs when muscle damage causes the release of molecules into the bloodstream, which results in kidney damage.  Acromegaly. This is a condition that causes enlarged bones.  Gigantism. Abnormally low levels of serum creatinine can also be caused by many health conditions. These may include:  Conditions that cause you to be inactive throughout much of the day.  Decreased muscle mass. Talk with your health care provider to discuss your results, treatment options, and if necessary, the need for more tests. Talk with your health care provider if you have any questions about your results.   This information is not intended to replace advice given to you by your health care provider. Make sure you discuss any questions you have with your health care provider.   Document Released: 07/30/2004 Document Revised: 07/28/2014 Document Reviewed: 12/01/2013 Elsevier Interactive Patient Education 2016 Elsevier Inc.  

## 2016-04-14 NOTE — Progress Notes (Signed)
BUN 25 cr 1.38 TG 230s   BP 137/88   Pulse 77   Temp 97.7 F (36.5 C) (Oral)   Ht 6' 6"  (1.981 m)   Wt 237 lb 3.2 oz (107.6 kg)   BMI 27.41 kg/m    Subjective:    Patient ID: Luis Ford, male    DOB: 1961/05/27, 55 y.o.   MRN: [id number]  HPI: Luis Ford is a 55 y.o. male presenting on 04/14/2016 for elevated kidney function (Had blood drawn 1 month ago at work and kidney function was elevated)  This summer at his work lab appointment at [employer] and elevated creatinine was found. It was at 1.38. Last year it was normal. Last year his BUN was slightly elevated but improved this year. He has been taking a lot of NSAIDs related to an injury he had to his back with the tensioner. He has since then been put on Mobic 15 mg 1 daily to see if this can preserve kidneys. He does take lisinopril hydrochlorothiazide at this time. He states he is not having any symptoms related to kidney or output. Last week drawn today and sent to the nurse practitioner at his employment.   Relevant past medical, surgical, family and social history reviewed and updated as indicated. Interim medical history since our last visit reviewed. Allergies and medications reviewed and updated. DATA REVIEWED: CHART IN EPIC  Social History   Social History  . Marital status: Single    Spouse name: N/A  . Number of children: 2  . Years of education: N/A   Occupational History  . loader [employer]   Social History Main Topics  . Smoking status: Never Smoker  . Smokeless tobacco: Never Used  . Alcohol use No  . Drug use: No  . Sexual activity: Not on file   Other Topics Concern  . Not on file   Social History Narrative   2 healthy sons    Lives w/ girlfriend    Past Surgical History:  Procedure Laterality Date  . APPENDECTOMY    . COLONOSCOPY  2003   [facility]-normal  . COLONOSCOPY  04/15/2012   Procedure: COLONOSCOPY;  Surgeon: Daneil Dolin, MD;  Location: [facility];  Service: Endoscopy;   Laterality: N/A;  8:45  . HERNIA REPAIR     right inguinal  . TOE AMPUTATION    . TRACHEOSTOMY     infant/closure as child    Family History  Problem Relation Age of Onset  . Coronary artery disease Mother   . Colon cancer Brother 51    Review of Systems  Constitutional: Negative.  Negative for appetite change and fatigue.  Eyes: Negative for pain and visual disturbance.  Respiratory: Negative.  Negative for cough, chest tightness, shortness of breath and wheezing.   Cardiovascular: Negative.  Negative for chest pain, palpitations and leg swelling.  Gastrointestinal: Negative.  Negative for abdominal pain, diarrhea, nausea and vomiting.  Genitourinary: Negative.   Skin: Negative.  Negative for color change and rash.  Neurological: Negative.  Negative for weakness, numbness and headaches.  Psychiatric/Behavioral: Negative.       Medication List       Accurate as of 04/14/16  1:17 PM. Always use your most recent med list.          atorvastatin 20 MG tablet Commonly known as:  LIPITOR Take 20 mg by mouth daily.   lisinopril-hydrochlorothiazide 20-12.5 MG tablet Commonly known as:  ZESTORETIC Take 2 tablets by mouth daily.  meloxicam 15 MG tablet Commonly known as:  MOBIC Take 15 mg by mouth daily.          Objective:    BP 137/88   Pulse 77   Temp 97.7 F (36.5 C) (Oral)   Ht 6' 6"  (1.981 m)   Wt 237 lb 3.2 oz (107.6 kg)   BMI 27.41 kg/m   Allergies  Allergen Reactions  . Penicillins Other (See Comments)    Childhood illness     Wt Readings from Last 3 Encounters:  04/14/16 237 lb 3.2 oz (107.6 kg)  09/07/15 232 lb 3.2 oz (105.3 kg)  02/20/15 228 lb 6.4 oz (103.6 kg)    Physical Exam  Constitutional: He appears well-developed and well-nourished. No distress.  HENT:  Head: Normocephalic and atraumatic.  Eyes: Conjunctivae and EOM are normal. Pupils are equal, round, and reactive to light.  Cardiovascular: Normal rate, regular rhythm and normal  heart sounds.   Pulmonary/Chest: Effort normal and breath sounds normal. No respiratory distress.  Skin: Skin is warm and dry.  Psychiatric: He has a normal mood and affect. His behavior is normal.  Nursing note and vitals reviewed.   Results for orders placed or performed in visit on 09/07/15  CMP14+EGFR  Result Value Ref Range   Glucose 91 65 - 99 mg/dL   BUN 25 (H) 6 - 24 mg/dL   Creatinine, Ser 1.25 0.76 - 1.27 mg/dL   GFR calc non Af Amer 65 >59 mL/min/1.73   GFR calc Af Amer 75 >59 mL/min/1.73   BUN/Creatinine Ratio 20 9 - 20   Sodium 139 134 - 144 mmol/L   Potassium 4.7 3.5 - 5.2 mmol/L   Chloride 97 96 - 106 mmol/L   CO2 25 18 - 29 mmol/L   Calcium 9.5 8.7 - 10.2 mg/dL   Total Protein 6.8 6.0 - 8.5 g/dL   Albumin 4.4 3.5 - 5.5 g/dL   Globulin, Total 2.4 1.5 - 4.5 g/dL   Albumin/Globulin Ratio 1.8 1.1 - 2.5   Bilirubin Total 0.2 0.0 - 1.2 mg/dL   Alkaline Phosphatase 83 39 - 117 IU/L   AST 24 0 - 40 IU/L   ALT 36 0 - 44 IU/L      Assessment & Plan:   1. Hyperlipidemia Continue atorvastatin   2. Elevated serum creatinine - BUN - Creatinine, serum  3. Essential hypertension - BUN - Creatinine, serum Continue lisinopril hydrochlorothiazide.  Continue all other maintenance medications as listed above.  Follow up plan: Return if symptoms worsen or fail to improve.   Orders Placed This Encounter  Procedures  . BUN  . Creatinine, serum    Educational handout given for serum creatinine  Terald Sleeper PA-C [place] [address]  [place], [place] [id number] [phone]   04/14/2016, 1:17 PM

## 2016-06-11 ENCOUNTER — Other Ambulatory Visit: Payer: Self-pay | Admitting: *Deleted

## 2016-06-11 MED ORDER — MELOXICAM 15 MG PO TABS
7.5000 mg | ORAL_TABLET | Freq: Every day | ORAL | 0 refills | Status: DC
Start: 2016-06-11 — End: 2019-02-14

## 2016-07-04 ENCOUNTER — Encounter: Payer: Self-pay | Admitting: Family Medicine

## 2016-07-04 ENCOUNTER — Ambulatory Visit (INDEPENDENT_AMBULATORY_CARE_PROVIDER_SITE_OTHER): Payer: BLUE CROSS/BLUE SHIELD | Admitting: Family Medicine

## 2016-07-04 VITALS — BP 129/77 | HR 98 | Temp 97.1°F | Ht 78.0 in | Wt 235.0 lb

## 2016-07-04 DIAGNOSIS — F411 Generalized anxiety disorder: Secondary | ICD-10-CM | POA: Diagnosis not present

## 2016-07-04 DIAGNOSIS — K219 Gastro-esophageal reflux disease without esophagitis: Secondary | ICD-10-CM | POA: Insufficient documentation

## 2016-07-04 DIAGNOSIS — K2100 Gastro-esophageal reflux disease with esophagitis, without bleeding: Secondary | ICD-10-CM

## 2016-07-04 DIAGNOSIS — T8131XA Disruption of external operation (surgical) wound, not elsewhere classified, initial encounter: Secondary | ICD-10-CM

## 2016-07-04 DIAGNOSIS — F419 Anxiety disorder, unspecified: Secondary | ICD-10-CM | POA: Insufficient documentation

## 2016-07-04 MED ORDER — ESCITALOPRAM OXALATE 10 MG PO TABS: 10.0000 mg | ORAL_TABLET | Freq: Every day | ORAL | 1 refills | Status: DC

## 2016-07-04 MED ORDER — OMEPRAZOLE 20 MG PO CPDR: 20.0000 mg | DELAYED_RELEASE_CAPSULE | Freq: Every day | ORAL | 3 refills | Status: DC

## 2016-07-04 NOTE — Progress Notes (Signed)
BP 129/77   Pulse 98   Temp 97.1 F (36.2 C) (Oral)   Ht 6\' 6"  (1.981 m)   Wt 235 lb (106.6 kg)   BMI 27.16 kg/m    Subjective:    Patient ID: Luis Ford, male    DOB: 09/06/1960, 55 y.o.   MRN: [id number]  HPI: Luis KandBobby K Ford is a 55 y.o. male presenting on 07/04/2016 for Anxiety (reports his job has gotten very stressful and he feels very anxious, having episodes of nausea.  [facility] because he was having chest pains, nausea - was admitted for labs and scans)   HPI Globus sensation and food getting stuck and stomach and throat. Patient has been having a sensation over the past 6 months at least that things get stuck in his throat and he wakes up in the morning and has a lot of nausea and belching and gas and sometimes some vomiting he has had some issues with heartburn intermittently in the past and has taken Rolaids intermittently. He denies any blood in his stool or blood in his vomiting. He says he wakes up most mornings with this feeling of nausea and abdominal discomfort. He denies any abdominal pain but more just like he feels like things get stuck in his throat and in his stomach. He denies any chest pain or shortness of breath or wheezing. He does take Mobic at times.  Anxiety  With his new issues of having to miss work sometimes for her health problems. He has been told that if he misses anymore work he could get fired and he is concerned about his health issues that he has been having and has been causing him to feel a lot more anxious because of the possibility that he may miss work because of these health problems. 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 He denies any issues with depression or sadness. He has been having some issues with sleep but more just feeling tired all the time then not able to sleep at night. He denies any suicidal ideations or thoughts of hurting himself.  Abdominal  wound Patient has been fighting and intermittent abdominal wound from an incision that he had for an appendectomy which was couple years ago. The wound will close up and then that will open up almost immediately again. He has gone back to see the surgeon and they have not wanted to touch it yet. She just a small spot the size of a pencil that is open on the right further most aspect of the incision. There is no erythema, he has slight tenderness with it but not much. He gets some clear drainage out of it but nothing thicker purulent.  Relevant past medical, surgical, family and social history reviewed and updated as indicated. Interim medical history since our last visit reviewed. Allergies and medications reviewed and updated.  Review of Systems  Constitutional: Negative for chills and fever.  Respiratory: Negative for shortness of breath and wheezing.   Cardiovascular: Negative for chest pain and leg swelling.  Gastrointestinal: Positive for abdominal distention (Does have some bloating sometimes), abdominal pain (Described as more of a discomfort), nausea and vomiting. Negative for blood in stool and diarrhea.  Musculoskeletal: Negative for back pain and gait problem.  Skin: Positive for wound. Negative for rash.  Psychiatric/Behavioral: Negative for decreased concentration, dysphoric mood, self-injury, sleep disturbance and suicidal ideas. The  patient is nervous/anxious.   All other systems reviewed and are negative.  Per HPI unless specifically indicated above     Objective:    BP 129/77   Pulse 98   Temp 97.1 F (36.2 C) (Oral)   Ht 6\' 6"  (1.981 m)   Wt 235 lb (106.6 kg)   BMI 27.16 kg/m   Wt Readings from Last 3 Encounters:  07/04/16 235 lb (106.6 kg)  04/14/16 237 lb 3.2 oz (107.6 kg)  09/07/15 232 lb 3.2 oz (105.3 kg)    Physical Exam  Constitutional: He is oriented to person, place, and time. He appears well-developed and well-nourished. No distress.  Eyes: Conjunctivae  are normal. Right eye exhibits no discharge. No scleral icterus.  Cardiovascular: Normal rate, regular rhythm, normal heart sounds and intact distal pulses.   No murmur heard. Pulmonary/Chest: Effort normal and breath sounds normal. No respiratory distress. He has no wheezes. He has no rales.  Abdominal: Soft. Bowel sounds are normal. He exhibits no distension. There is no tenderness (No tenderness on exam). There is no rebound and no guarding.  Musculoskeletal: Normal range of motion. He exhibits no edema.  Neurological: He is alert and oriented to person, place, and time. Coordination normal.  Skin: Skin is warm and dry. Lesion (Small opening on the right most part of his healed incision. 0.2 cm deep and the size of a pencil. Straw-colored drainage. Good granulation tissue at base.) noted. No rash noted. He is not diaphoretic.  Psychiatric: His behavior is normal. Judgment normal. His mood appears anxious. He does not exhibit a depressed mood. He expresses no suicidal ideation. He expresses no suicidal plans.  Nursing note and vitals reviewed.  Wound care: Applied Xeroform gauze and folded 3 x 3 and tape and instructed patient to change daily and gave him samples of Xeroform gauze    Assessment & Plan:   Problem List Items Addressed This Visit      Digestive   GERD (gastroesophageal reflux disease) - Primary    Patient has globus sensation and excess belching and burping, also some concerns that he may have a stricture based on his story. We'll try omeprazole first have a short leash to refer to gastroenterology. [facility] and [facility] a lot of labs done for this, no need to draw any labs today      Relevant Medications   omeprazole (PRILOSEC) 20 MG capsule     Other   Anxiety state    Been going on for the past 6 months as job stress has increased. Will try Lexapro and see how it helps him      Relevant Medications   escitalopram (LEXAPRO) 10 MG tablet    Other  Visit Diagnoses    Postoperative wound dehiscence, initial encounter       Instructed patient to do Xeroform gauze and 4 x 4 over top and tape and change daily, small opening, probably related to subcutaneous suture but could not find        Follow up plan: Return in about 4 weeks (around 08/01/2016), or if symptoms worsen or fail to improve, for Recheck anxiety and GERD.  Counseling provided for all of the vaccine components No orders of the defined types were placed in this encounter.   Arville CareJoshua Gustavo Meditz, MD [facility] 07/04/2016, 9:35 AM

## 2016-07-04 NOTE — Assessment & Plan Note (Addendum)
Patient has globus sensation and excess belching and burping, also some concerns that he may have a stricture based on his story. We'll try omeprazole first have a short leash to refer to gastroenterology. [facility] and [facility] lot of labs done for this, no need to draw any labs today

## 2016-07-04 NOTE — Assessment & Plan Note (Signed)
Been going on for the past 6 months as job stress has increased. Will try Lexapro and see how it helps him

## 2016-08-01 ENCOUNTER — Other Ambulatory Visit: Payer: Self-pay | Admitting: Family Medicine

## 2016-08-01 DIAGNOSIS — F411 Generalized anxiety disorder: Secondary | ICD-10-CM

## 2016-08-06 ENCOUNTER — Ambulatory Visit: Payer: BLUE CROSS/BLUE SHIELD | Admitting: Family Medicine

## 2016-08-22 ENCOUNTER — Other Ambulatory Visit: Payer: Self-pay | Admitting: Family Medicine

## 2016-08-22 DIAGNOSIS — F411 Generalized anxiety disorder: Secondary | ICD-10-CM

## 2016-08-22 MED ORDER — ESCITALOPRAM OXALATE 10 MG PO TABS
10.0000 mg | ORAL_TABLET | Freq: Every day | ORAL | 1 refills | Status: DC
Start: 2016-08-22 — End: 2016-09-27

## 2016-08-22 NOTE — Telephone Encounter (Signed)
done

## 2016-09-01 DIAGNOSIS — E785 Hyperlipidemia, unspecified: Secondary | ICD-10-CM | POA: Diagnosis not present

## 2016-09-01 DIAGNOSIS — R7989 Other specified abnormal findings of blood chemistry: Secondary | ICD-10-CM | POA: Diagnosis not present

## 2016-09-01 DIAGNOSIS — I10 Essential (primary) hypertension: Secondary | ICD-10-CM | POA: Diagnosis not present

## 2016-09-01 DIAGNOSIS — Z008 Encounter for other general examination: Secondary | ICD-10-CM | POA: Diagnosis not present

## 2016-09-15 DIAGNOSIS — Z7189 Other specified counseling: Secondary | ICD-10-CM | POA: Diagnosis not present

## 2016-09-24 DIAGNOSIS — J0100 Acute maxillary sinusitis, unspecified: Secondary | ICD-10-CM | POA: Diagnosis not present

## 2016-09-24 DIAGNOSIS — J029 Acute pharyngitis, unspecified: Secondary | ICD-10-CM | POA: Diagnosis not present

## 2016-09-27 ENCOUNTER — Other Ambulatory Visit: Payer: Self-pay | Admitting: Family Medicine

## 2016-09-27 DIAGNOSIS — F411 Generalized anxiety disorder: Secondary | ICD-10-CM

## 2016-12-02 ENCOUNTER — Other Ambulatory Visit: Payer: Self-pay

## 2016-12-02 DIAGNOSIS — F411 Generalized anxiety disorder: Secondary | ICD-10-CM

## 2016-12-02 MED ORDER — ESCITALOPRAM OXALATE 10 MG PO TABS: 10.0000 mg | ORAL_TABLET | Freq: Every day | ORAL | 0 refills | Status: DC

## 2016-12-10 DIAGNOSIS — Z79899 Other long term (current) drug therapy: Secondary | ICD-10-CM | POA: Diagnosis not present

## 2016-12-10 DIAGNOSIS — E785 Hyperlipidemia, unspecified: Secondary | ICD-10-CM | POA: Diagnosis not present

## 2016-12-10 DIAGNOSIS — Z139 Encounter for screening, unspecified: Secondary | ICD-10-CM | POA: Diagnosis not present

## 2016-12-22 DIAGNOSIS — E785 Hyperlipidemia, unspecified: Secondary | ICD-10-CM | POA: Diagnosis not present

## 2016-12-22 DIAGNOSIS — I10 Essential (primary) hypertension: Secondary | ICD-10-CM | POA: Diagnosis not present

## 2016-12-22 DIAGNOSIS — M2550 Pain in unspecified joint: Secondary | ICD-10-CM | POA: Diagnosis not present

## 2017-01-09 ENCOUNTER — Other Ambulatory Visit: Payer: Self-pay

## 2017-01-09 DIAGNOSIS — F411 Generalized anxiety disorder: Secondary | ICD-10-CM

## 2017-01-09 MED ORDER — ESCITALOPRAM OXALATE 10 MG PO TABS: 10.0000 mg | ORAL_TABLET | Freq: Every day | ORAL | 1 refills | Status: DC

## 2017-01-09 NOTE — Telephone Encounter (Signed)
Last seen 07/04/16  Dr Louanne Skyeettinger  Neysa Bonitohristy PCP

## 2017-04-13 DIAGNOSIS — Z79899 Other long term (current) drug therapy: Secondary | ICD-10-CM | POA: Diagnosis not present

## 2017-04-13 DIAGNOSIS — Z139 Encounter for screening, unspecified: Secondary | ICD-10-CM | POA: Diagnosis not present

## 2017-04-13 DIAGNOSIS — E785 Hyperlipidemia, unspecified: Secondary | ICD-10-CM | POA: Diagnosis not present

## 2017-04-20 DIAGNOSIS — Z008 Encounter for other general examination: Secondary | ICD-10-CM | POA: Diagnosis not present

## 2017-04-20 DIAGNOSIS — Z719 Counseling, unspecified: Secondary | ICD-10-CM | POA: Diagnosis not present

## 2017-04-20 DIAGNOSIS — Z6834 Body mass index (BMI) 34.0-34.9, adult: Secondary | ICD-10-CM | POA: Diagnosis not present

## 2017-04-20 DIAGNOSIS — E785 Hyperlipidemia, unspecified: Secondary | ICD-10-CM | POA: Diagnosis not present

## 2017-04-20 DIAGNOSIS — M2550 Pain in unspecified joint: Secondary | ICD-10-CM | POA: Diagnosis not present

## 2017-04-20 DIAGNOSIS — E669 Obesity, unspecified: Secondary | ICD-10-CM | POA: Diagnosis not present

## 2017-04-20 DIAGNOSIS — I10 Essential (primary) hypertension: Secondary | ICD-10-CM | POA: Diagnosis not present

## 2017-08-11 ENCOUNTER — Other Ambulatory Visit: Payer: Self-pay | Admitting: Family Medicine

## 2017-08-11 DIAGNOSIS — K2100 Gastro-esophageal reflux disease with esophagitis, without bleeding: Secondary | ICD-10-CM

## 2017-08-13 ENCOUNTER — Other Ambulatory Visit: Payer: Self-pay | Admitting: Family Medicine

## 2017-08-13 DIAGNOSIS — K2100 Gastro-esophageal reflux disease with esophagitis, without bleeding: Secondary | ICD-10-CM

## 2017-08-19 DIAGNOSIS — J0110 Acute frontal sinusitis, unspecified: Secondary | ICD-10-CM | POA: Diagnosis not present

## 2017-08-19 DIAGNOSIS — E785 Hyperlipidemia, unspecified: Secondary | ICD-10-CM | POA: Diagnosis not present

## 2017-08-19 DIAGNOSIS — Z79899 Other long term (current) drug therapy: Secondary | ICD-10-CM | POA: Diagnosis not present

## 2017-08-19 DIAGNOSIS — Z139 Encounter for screening, unspecified: Secondary | ICD-10-CM | POA: Diagnosis not present

## 2017-08-31 DIAGNOSIS — E785 Hyperlipidemia, unspecified: Secondary | ICD-10-CM | POA: Diagnosis not present

## 2017-08-31 DIAGNOSIS — K219 Gastro-esophageal reflux disease without esophagitis: Secondary | ICD-10-CM | POA: Diagnosis not present

## 2017-08-31 DIAGNOSIS — R7989 Other specified abnormal findings of blood chemistry: Secondary | ICD-10-CM | POA: Diagnosis not present

## 2017-10-28 DIAGNOSIS — E785 Hyperlipidemia, unspecified: Secondary | ICD-10-CM | POA: Diagnosis not present

## 2017-10-28 DIAGNOSIS — E559 Vitamin D deficiency, unspecified: Secondary | ICD-10-CM | POA: Diagnosis not present

## 2017-10-28 DIAGNOSIS — Z79899 Other long term (current) drug therapy: Secondary | ICD-10-CM | POA: Diagnosis not present

## 2017-10-28 DIAGNOSIS — Z139 Encounter for screening, unspecified: Secondary | ICD-10-CM | POA: Diagnosis not present

## 2017-11-16 DIAGNOSIS — R7301 Impaired fasting glucose: Secondary | ICD-10-CM | POA: Diagnosis not present

## 2017-11-16 DIAGNOSIS — E785 Hyperlipidemia, unspecified: Secondary | ICD-10-CM | POA: Diagnosis not present

## 2017-11-16 DIAGNOSIS — E559 Vitamin D deficiency, unspecified: Secondary | ICD-10-CM | POA: Diagnosis not present

## 2017-12-09 DIAGNOSIS — E785 Hyperlipidemia, unspecified: Secondary | ICD-10-CM | POA: Diagnosis not present

## 2017-12-09 DIAGNOSIS — Z719 Counseling, unspecified: Secondary | ICD-10-CM | POA: Diagnosis not present

## 2017-12-09 DIAGNOSIS — I10 Essential (primary) hypertension: Secondary | ICD-10-CM | POA: Diagnosis not present

## 2017-12-09 DIAGNOSIS — Z008 Encounter for other general examination: Secondary | ICD-10-CM | POA: Diagnosis not present

## 2018-02-02 DIAGNOSIS — K529 Noninfective gastroenteritis and colitis, unspecified: Secondary | ICD-10-CM | POA: Diagnosis not present

## 2018-02-02 DIAGNOSIS — K5730 Diverticulosis of large intestine without perforation or abscess without bleeding: Secondary | ICD-10-CM | POA: Diagnosis not present

## 2018-02-02 DIAGNOSIS — R1032 Left lower quadrant pain: Secondary | ICD-10-CM | POA: Diagnosis not present

## 2018-02-02 DIAGNOSIS — R112 Nausea with vomiting, unspecified: Secondary | ICD-10-CM | POA: Diagnosis not present

## 2018-02-02 DIAGNOSIS — E7800 Pure hypercholesterolemia, unspecified: Secondary | ICD-10-CM | POA: Diagnosis not present

## 2018-02-02 DIAGNOSIS — Z79899 Other long term (current) drug therapy: Secondary | ICD-10-CM | POA: Diagnosis not present

## 2018-02-02 DIAGNOSIS — I10 Essential (primary) hypertension: Secondary | ICD-10-CM | POA: Diagnosis not present

## 2018-02-24 ENCOUNTER — Other Ambulatory Visit: Payer: Self-pay | Admitting: Family

## 2018-02-24 DIAGNOSIS — F411 Generalized anxiety disorder: Secondary | ICD-10-CM

## 2018-02-24 NOTE — Telephone Encounter (Signed)
LMOVM NTBS last OV was 07/04/2016

## 2018-03-01 DIAGNOSIS — F419 Anxiety disorder, unspecified: Secondary | ICD-10-CM | POA: Diagnosis not present

## 2018-03-08 DIAGNOSIS — I10 Essential (primary) hypertension: Secondary | ICD-10-CM | POA: Diagnosis not present

## 2018-03-08 DIAGNOSIS — E785 Hyperlipidemia, unspecified: Secondary | ICD-10-CM | POA: Diagnosis not present

## 2018-03-08 DIAGNOSIS — Z719 Counseling, unspecified: Secondary | ICD-10-CM | POA: Diagnosis not present

## 2018-03-08 DIAGNOSIS — F419 Anxiety disorder, unspecified: Secondary | ICD-10-CM | POA: Diagnosis not present

## 2018-03-08 DIAGNOSIS — R7989 Other specified abnormal findings of blood chemistry: Secondary | ICD-10-CM | POA: Diagnosis not present

## 2018-03-08 DIAGNOSIS — R7301 Impaired fasting glucose: Secondary | ICD-10-CM | POA: Diagnosis not present

## 2018-03-08 DIAGNOSIS — Z79899 Other long term (current) drug therapy: Secondary | ICD-10-CM | POA: Diagnosis not present

## 2018-03-08 DIAGNOSIS — Z139 Encounter for screening, unspecified: Secondary | ICD-10-CM | POA: Diagnosis not present

## 2018-03-08 DIAGNOSIS — Z008 Encounter for other general examination: Secondary | ICD-10-CM | POA: Diagnosis not present

## 2018-03-24 DIAGNOSIS — I10 Essential (primary) hypertension: Secondary | ICD-10-CM | POA: Diagnosis not present

## 2018-03-24 DIAGNOSIS — E785 Hyperlipidemia, unspecified: Secondary | ICD-10-CM | POA: Diagnosis not present

## 2018-03-24 DIAGNOSIS — R7301 Impaired fasting glucose: Secondary | ICD-10-CM | POA: Diagnosis not present

## 2018-04-13 DIAGNOSIS — Z23 Encounter for immunization: Secondary | ICD-10-CM | POA: Diagnosis not present

## 2018-06-21 DIAGNOSIS — Z008 Encounter for other general examination: Secondary | ICD-10-CM | POA: Diagnosis not present

## 2018-06-21 DIAGNOSIS — Z719 Counseling, unspecified: Secondary | ICD-10-CM | POA: Diagnosis not present

## 2018-06-21 DIAGNOSIS — E785 Hyperlipidemia, unspecified: Secondary | ICD-10-CM | POA: Diagnosis not present

## 2018-06-21 DIAGNOSIS — I10 Essential (primary) hypertension: Secondary | ICD-10-CM | POA: Diagnosis not present

## 2018-08-04 DIAGNOSIS — J0100 Acute maxillary sinusitis, unspecified: Secondary | ICD-10-CM | POA: Diagnosis not present

## 2018-08-23 DIAGNOSIS — Z008 Encounter for other general examination: Secondary | ICD-10-CM | POA: Diagnosis not present

## 2018-08-23 DIAGNOSIS — I10 Essential (primary) hypertension: Secondary | ICD-10-CM | POA: Diagnosis not present

## 2018-08-23 DIAGNOSIS — R7301 Impaired fasting glucose: Secondary | ICD-10-CM | POA: Diagnosis not present

## 2018-08-23 DIAGNOSIS — E785 Hyperlipidemia, unspecified: Secondary | ICD-10-CM | POA: Diagnosis not present

## 2018-12-02 ENCOUNTER — Telehealth: Payer: Self-pay | Admitting: Family

## 2019-01-05 DIAGNOSIS — R7301 Impaired fasting glucose: Secondary | ICD-10-CM | POA: Diagnosis not present

## 2019-01-05 DIAGNOSIS — Z139 Encounter for screening, unspecified: Secondary | ICD-10-CM | POA: Diagnosis not present

## 2019-01-05 DIAGNOSIS — E785 Hyperlipidemia, unspecified: Secondary | ICD-10-CM | POA: Diagnosis not present

## 2019-01-05 DIAGNOSIS — Z79899 Other long term (current) drug therapy: Secondary | ICD-10-CM | POA: Diagnosis not present

## 2019-01-12 ENCOUNTER — Telehealth: Payer: Self-pay | Admitting: Family

## 2019-01-12 DIAGNOSIS — F419 Anxiety disorder, unspecified: Secondary | ICD-10-CM | POA: Diagnosis not present

## 2019-01-12 DIAGNOSIS — I10 Essential (primary) hypertension: Secondary | ICD-10-CM | POA: Diagnosis not present

## 2019-01-12 DIAGNOSIS — E785 Hyperlipidemia, unspecified: Secondary | ICD-10-CM | POA: Diagnosis not present

## 2019-01-12 DIAGNOSIS — R7309 Other abnormal glucose: Secondary | ICD-10-CM | POA: Diagnosis not present

## 2019-01-12 NOTE — Telephone Encounter (Signed)
appt scheduled

## 2019-02-11 ENCOUNTER — Other Ambulatory Visit: Payer: Self-pay

## 2019-02-14 ENCOUNTER — Other Ambulatory Visit: Payer: Self-pay

## 2019-02-14 ENCOUNTER — Ambulatory Visit: Payer: BC Managed Care – PPO | Admitting: Family Medicine

## 2019-02-14 ENCOUNTER — Encounter: Payer: Self-pay | Admitting: Family Medicine

## 2019-02-14 VITALS — BP 126/82 | HR 69 | Temp 98.4°F | Ht 78.0 in | Wt 253.8 lb

## 2019-02-14 DIAGNOSIS — N529 Male erectile dysfunction, unspecified: Secondary | ICD-10-CM

## 2019-02-14 DIAGNOSIS — K2100 Gastro-esophageal reflux disease with esophagitis, without bleeding: Secondary | ICD-10-CM

## 2019-02-14 DIAGNOSIS — E782 Mixed hyperlipidemia: Secondary | ICD-10-CM | POA: Diagnosis not present

## 2019-02-14 DIAGNOSIS — I10 Essential (primary) hypertension: Secondary | ICD-10-CM

## 2019-02-14 DIAGNOSIS — E119 Type 2 diabetes mellitus without complications: Secondary | ICD-10-CM

## 2019-02-14 DIAGNOSIS — E559 Vitamin D deficiency, unspecified: Secondary | ICD-10-CM

## 2019-02-14 MED ORDER — SILDENAFIL CITRATE 20 MG PO TABS: 20.0000 mg | ORAL_TABLET | ORAL | 1 refills | Status: DC | PRN

## 2019-02-14 MED ORDER — VITAMIN D 125 MCG (5000 UT) PO CAPS: 1.0000 | ORAL_CAPSULE | Freq: Every day | ORAL | 1 refills | Status: DC

## 2019-02-14 MED ORDER — LISINOPRIL-HYDROCHLOROTHIAZIDE 20-12.5 MG PO TABS: 2.0000 | ORAL_TABLET | Freq: Every day | ORAL | 1 refills | Status: DC

## 2019-02-14 MED ORDER — ROSUVASTATIN CALCIUM 40 MG PO TABS: 40.0000 mg | ORAL_TABLET | Freq: Every day | ORAL | 3 refills | Status: DC

## 2019-02-14 NOTE — Patient Instructions (Signed)
Diabetes Mellitus and Standards of Medical Care Managing diabetes (diabetes mellitus) can be complicated. Your diabetes treatment may be managed by a team of health care providers, including:  A physician who specializes in diabetes (endocrinologist).  A nurse practitioner or physician assistant.  Nurses.  A diet and nutrition specialist (registered dietitian).  A certified diabetes educator (CDE).  An exercise specialist.  A pharmacist.  An eye doctor.  A foot specialist (podiatrist).  A dentist.  A primary care provider.  A mental health provider. Your health care providers follow guidelines to help you get the best quality of care. The following schedule is a general guideline for your diabetes management plan. Your health care providers may give you more specific instructions. Physical exams Upon being diagnosed with diabetes mellitus, and each year after that, your health care provider will ask about your medical and family history. He or she will also do a physical exam. Your exam may include:  Measuring your height, weight, and body mass index (BMI).  Checking your blood pressure. This will be done at every routine medical visit. Your target blood pressure may vary depending on your medical conditions, your age, and other factors.  Thyroid gland exam.  Skin exam.  Screening for damage to your nerves (peripheral neuropathy). This may include checking the pulse in your legs and feet and checking the level of sensation in your hands and feet.  A complete foot exam to inspect the structure and skin of your feet, including checking for cuts, bruises, redness, blisters, sores, or other problems.  Screening for blood vessel (vascular) problems, which may include checking the pulse in your legs and feet and checking your temperature. Blood tests Depending on your treatment plan and your personal needs, you may have the following tests done:  HbA1c (hemoglobin A1c). This  test provides information about blood sugar (glucose) control over the previous 2-3 months. It is used to adjust your treatment plan, if needed. This test will be done: ? At least 2 times a year, if you are meeting your treatment goals. ? 4 times a year, if you are not meeting your treatment goals or if treatment goals have changed.  Lipid testing, including total, LDL, and HDL cholesterol and triglyceride levels. ? The goal for LDL is less than 100 mg/dL (5.5 mmol/L). If you are at high risk for complications, the goal is less than 70 mg/dL (3.9 mmol/L). ? The goal for HDL is 40 mg/dL (2.2 mmol/L) or higher for men and 50 mg/dL (2.8 mmol/L) or higher for women. An HDL cholesterol of 60 mg/dL (3.3 mmol/L) or higher gives some protection against heart disease. ? The goal for triglycerides is less than 150 mg/dL (8.3 mmol/L).  Liver function tests.  Kidney function tests.  Thyroid function tests. Dental and eye exams  Visit your dentist two times a year.  If you have type 1 diabetes, your health care provider may recommend an eye exam 3-5 years after you are diagnosed, and then once a year after your first exam. ? For children with type 1 diabetes, a health care provider may recommend an eye exam when your child is age 10 or older and has had diabetes for 3-5 years. After the first exam, your child should get an eye exam once a year.  If you have type 2 diabetes, your health care provider may recommend an eye exam as soon as you are diagnosed, and then once a year after your first exam. Immunizations   The   yearly flu (influenza) vaccine is recommended for everyone 6 months or older who has diabetes.  The pneumonia (pneumococcal) vaccine is recommended for everyone 2 years or older who has diabetes. If you are 5965 or older, you may get the pneumonia vaccine as a series of two separate shots.  The hepatitis B vaccine is recommended for adults shortly after being diagnosed with diabetes.   Adults and children with diabetes should receive all other vaccines according to age-specific recommendations from the Centers for Disease Control and Prevention (CDC). Mental and emotional health Screening for symptoms of eating disorders, anxiety, and depression is recommended at the time of diagnosis and afterward as needed. If your screening shows that you have symptoms (positive screening result), you may need more evaluation and you may work with a mental health care provider. Treatment plan Your treatment plan will be reviewed at every medical visit. You and your health care provider will discuss:  How you are taking your medicines, including insulin.  Any side effects you are experiencing.  Your blood glucose target goals.  The frequency of your blood glucose monitoring.  Lifestyle habits, such as activity level as well as tobacco, alcohol, and substance use. Diabetes self-management education Your health care provider will assess how well you are monitoring your blood glucose levels and whether you are taking your insulin correctly. He or she may refer you to:  A certified diabetes educator to manage your diabetes throughout your life, starting at diagnosis.  A registered dietitian who can create or review your personal nutrition plan.  An exercise specialist who can discuss your activity level and exercise plan. Summary  Managing diabetes (diabetes mellitus) can be complicated. Your diabetes treatment may be managed by a team of health care providers.  Your health care providers follow guidelines in order to help you get the best quality of care.  Standards of care including having regular physical exams, blood tests, blood pressure monitoring, immunizations, screening tests, and education about how to manage your diabetes.  Your health care providers may also give you more specific instructions based on your individual health. This information is not intended to replace  advice given to you by your health care provider. Make sure you discuss any questions you have with your health care provider. Document Released: 05/04/2009 Document Revised: 03/26/2018 Document Reviewed: 04/04/2016 Elsevier Patient Education  2020 [employer]   Diabetes Mellitus and Nutrition, Adult When you have diabetes (diabetes mellitus), it is very important to have healthy eating habits because your blood sugar (glucose) levels are greatly affected by what you eat and drink. Eating healthy foods in the appropriate amounts, at about the same times every day, can help you:  Control your blood glucose.  Lower your risk of heart disease.  Improve your blood pressure.  Reach or maintain a healthy weight. Every person with diabetes is different, and each person has different needs for a meal plan. Your health care provider may recommend that you work with a diet and nutrition specialist (dietitian) to make a meal plan that is best for you. Your meal plan may vary depending on factors such as:  The calories you need.  The medicines you take.  Your weight.  Your blood glucose, blood pressure, and cholesterol levels.  Your activity level.  Other health conditions you have, such as heart or kidney disease. How do carbohydrates affect me? Carbohydrates, also called carbs, affect your blood glucose level more than any other type of food. Eating carbs naturally raises  the amount of glucose in your blood. Carb counting is a method for keeping track of how many carbs you eat. Counting carbs is important to keep your blood glucose at a healthy level, especially if you use insulin or take certain oral diabetes medicines. It is important to know how many carbs you can safely have in each meal. This is different for every person. Your dietitian can help you calculate how many carbs you should have at each meal and for each snack. Foods that contain carbs include:  Bread, cereal, rice, pasta,  and crackers.  Potatoes and corn.  Peas, beans, and lentils.  Milk and yogurt.  Fruit and juice.  Desserts, such as cakes, cookies, ice cream, and candy. How does alcohol affect me? Alcohol can cause a sudden decrease in blood glucose (hypoglycemia), especially if you use insulin or take certain oral diabetes medicines. Hypoglycemia can be a life-threatening condition. Symptoms of hypoglycemia (sleepiness, dizziness, and confusion) are similar to symptoms of having too much alcohol. If your health care provider says that alcohol is safe for you, follow these guidelines:  Limit alcohol intake to no more than 1 drink per day for nonpregnant women and 2 drinks per day for men. One drink equals 12 oz of beer, 5 oz of wine, or 1 oz of hard liquor.  Do not drink on an empty stomach.  Keep yourself hydrated with water, diet soda, or unsweetened iced tea.  Keep in mind that regular soda, juice, and other mixers may contain a lot of sugar and must be counted as carbs. What are tips for following this plan?  Reading food labels  Start by checking the serving size on the "Nutrition Facts" label of packaged foods and drinks. The amount of calories, carbs, fats, and other nutrients listed on the label is based on one serving of the item. Many items contain more than one serving per package.  Check the total grams (g) of carbs in one serving. You can calculate the number of servings of carbs in one serving by dividing the total carbs by 15. For example, if a food has 30 g of total carbs, it would be equal to 2 servings of carbs.  Check the number of grams (g) of saturated and trans fats in one serving. Choose foods that have low or no amount of these fats.  Check the number of milligrams (mg) of salt (sodium) in one serving. Most people should limit total sodium intake to less than 2,300 mg per day.  Always check the nutrition information of foods labeled as "low-fat" or "nonfat". These foods  may be higher in added sugar or refined carbs and should be avoided.  Talk to your dietitian to identify your daily goals for nutrients listed on the label. Shopping  Avoid buying canned, premade, or processed foods. These foods tend to be high in fat, sodium, and added sugar.  Shop around the outside edge of the grocery store. This includes fresh fruits and vegetables, bulk grains, fresh meats, and fresh dairy. Cooking  Use low-heat cooking methods, such as baking, instead of high-heat cooking methods like deep frying.  Cook using healthy oils, such as olive, canola, or sunflower oil.  Avoid cooking with butter, cream, or high-fat meats. Meal planning  Eat meals and snacks regularly, preferably at the same times every day. Avoid going long periods of time without eating.  Eat foods high in fiber, such as fresh fruits, vegetables, beans, and whole grains. Talk to your dietitian  about how many servings of carbs you can eat at each meal.  Eat 4-6 ounces (oz) of lean protein each day, such as lean meat, chicken, fish, eggs, or tofu. One oz of lean protein is equal to: ? 1 oz of meat, chicken, or fish. ? 1 egg. ?  cup of tofu.  Eat some foods each day that contain healthy fats, such as avocado, nuts, seeds, and fish. Lifestyle  Check your blood glucose regularly.  Exercise regularly as told by your health care provider. This may include: ? 150 minutes of moderate-intensity or vigorous-intensity exercise each week. This could be brisk walking, biking, or water aerobics. ? Stretching and doing strength exercises, such as yoga or weightlifting, at least 2 times a week.  Take medicines as told by your health care provider.  Do not use any products that contain nicotine or tobacco, such as cigarettes and e-cigarettes. If you need help quitting, ask your health care provider.  Work with a Veterinary surgeoncounselor or diabetes educator to identify strategies to manage stress and any emotional and social  challenges. Questions to ask a health care provider  Do I need to meet with a diabetes educator?  Do I need to meet with a dietitian?  What number can I call if I have questions?  When are the best times to check my blood glucose? Where to find more information:  American Diabetes Association: diabetes.org  Academy of Nutrition and Dietetics: www.eatright.[employer]  National Institute of Diabetes and Digestive and Kidney Diseases (NIH): Car[employer].tnwww.niddk.nih.gov Summary  A healthy meal plan will help you control your blood glucose and maintain a healthy lifestyle.  Working with a diet and nutrition specialist (dietitian) can help you make a meal plan that is best for you.  Keep in mind that carbohydrates (carbs) and alcohol have immediate effects on your blood glucose levels. It is important to count carbs and to use alcohol carefully. This information is not intended to replace advice given to you by your health care provider. Make sure you discuss any questions you have with your health care provider. Document Released: 04/03/2005 Document Revised: 06/19/2017 Document Reviewed: 08/11/2016 Elsevier Patient Education  2020 [employer]   Diabetes Mellitus and Exercise Exercising regularly is important for your overall health, especially when you have diabetes (diabetes mellitus). Exercising is not only about losing weight. It has many other health benefits, such as increasing muscle strength and bone density and reducing body fat and stress. This leads to improved fitness, flexibility, and endurance, all of which result in better overall health. Exercise has additional benefits for people with diabetes, including:  Reducing appetite.  Helping to lower and control blood glucose.  Lowering blood pressure.  Helping to control amounts of fatty substances (lipids) in the blood, such as cholesterol and triglycerides.  Helping the body to respond better to insulin (improving insulin  sensitivity).  Reducing how much insulin the body needs.  Decreasing the risk for heart disease by: ? Lowering cholesterol and triglyceride levels. ? Increasing the levels of good cholesterol. ? Lowering blood glucose levels. What is my activity plan? Your health care provider or certified diabetes educator can help you make a plan for the type and frequency of exercise (activity plan) that works for you. Make sure that you:  Do at least 150 minutes of moderate-intensity or vigorous-intensity exercise each week. This could be brisk walking, biking, or water aerobics. ? Do stretching and strength exercises, such as yoga or weightlifting, at least 2 times a week. ?  Spread out your activity over at least 3 days of the week.  Get some form of physical activity every day. ? Do not go more than 2 days in a row without some kind of physical activity. ? Avoid being inactive for more than 30 minutes at a time. Take frequent breaks to walk or stretch.  Choose a type of exercise or activity that you enjoy, and set realistic goals.  Start slowly, and gradually increase the intensity of your exercise over time. What do I need to know about managing my diabetes?   Check your blood glucose before and after exercising. ? If your blood glucose is 240 mg/dL (13.3 mmol/L) or higher before you exercise, check your urine for ketones. If you have ketones in your urine, do not exercise until your blood glucose returns to normal. ? If your blood glucose is 100 mg/dL (5.6 mmol/L) or lower, eat a snack containing 15-20 grams of carbohydrate. Check your blood glucose 15 minutes after the snack to make sure that your level is above 100 mg/dL (5.6 mmol/L) before you start your exercise.  Know the symptoms of low blood glucose (hypoglycemia) and how to treat it. Your risk for hypoglycemia increases during and after exercise. Common symptoms of hypoglycemia can include: ? Hunger. ? Anxiety. ? Sweating and feeling  clammy. ? Confusion. ? Dizziness or feeling light-headed. ? Increased heart rate or palpitations. ? Blurry vision. ? Tingling or numbness around the mouth, lips, or tongue. ? Tremors or shakes. ? Irritability.  Keep a rapid-acting carbohydrate snack available before, during, and after exercise to help prevent or treat hypoglycemia.  Avoid injecting insulin into areas of the body that are going to be exercised. For example, avoid injecting insulin into: ? The arms, when playing tennis. ? The legs, when jogging.  Keep records of your exercise habits. Doing this can help you and your health care provider adjust your diabetes management plan as needed. Write down: ? Food that you eat before and after you exercise. ? Blood glucose levels before and after you exercise. ? The type and amount of exercise you have done. ? When your insulin is expected to peak, if you use insulin. Avoid exercising at times when your insulin is peaking.  When you start a new exercise or activity, work with your health care provider to make sure the activity is safe for you, and to adjust your insulin, medicines, or food intake as needed.  Drink plenty of water while you exercise to prevent dehydration or heat stroke. Drink enough fluid to keep your urine clear or pale yellow. Summary  Exercising regularly is important for your overall health, especially when you have diabetes (diabetes mellitus).  Exercising has many health benefits, such as increasing muscle strength and bone density and reducing body fat and stress.  Your health care provider or certified diabetes educator can help you make a plan for the type and frequency of exercise (activity plan) that works for you.  When you start a new exercise or activity, work with your health care provider to make sure the activity is safe for you, and to adjust your insulin, medicines, or food intake as needed. This information is not intended to replace advice  given to you by your health care provider. Make sure you discuss any questions you have with your health care provider. Document Released: 09/27/2003 Document Revised: 01/29/2017 Document Reviewed: 12/17/2015 Elsevier Patient Education  2020 [employer].

## 2019-02-14 NOTE — Progress Notes (Signed)
BP 126/82   Pulse 69   Temp 98.4 F (36.9 C) (Temporal)   Ht 6\' 6"  (1.981 m)   Wt 253 lb 12.8 oz (115.1 kg)   BMI 29.33 kg/m    Subjective:   Patient ID: Luis Ford, male    DOB: 1961-05-09, 58 y.o.   MRN: [id number]  HPI: Luis KandBobby K Trevathan is a 58 y.o. male presenting on 02/14/2019 for Establish Care (Last seen x 2 years ago) and Medical Management of Chronic Issues   HPI Type 2 diabetes mellitus, new diagnosis Patient comes in today for recheck of his diabetes. Patient has been currently taking no medication and has recently told about the diabetes and has stopped sodas, his A1c was 6.6 at his workplace.. Patient is currently on an ACE inhibitor/ARB. Patient has not seen an ophthalmologist this year. Patient denies any issues with their feet.   Hypertension Patient is currently on lisinopril hydrochlorothiazide, and their blood pressure today is 126/82. Patient denies any lightheadedness or dizziness. Patient denies headaches, blurred vision, chest pains, shortness of breath, or weakness. Denies any side effects from medication and is content with current medication.   Hyperlipidemia Patient is coming in for recheck of his hyperlipidemia. The patient is currently taking Crestor. They deny any issues with myalgias or history of liver damage from it. They deny any focal numbness or weakness or chest pain.   ED  patient comes in complaining of erectile dysfunction this [place] of the past few years and is been increasing.  He would like to try something for it.  He says he just has trouble getting an erection and troubles maintaining an erection.  Relevant past medical, surgical, family and social history reviewed and updated as indicated. Interim medical history since our last visit reviewed. Allergies and medications reviewed and updated.  Review of Systems  Constitutional: Negative for chills and fever.  Eyes: Negative for visual disturbance.  Respiratory: Negative for  shortness of breath and wheezing.   Cardiovascular: Negative for chest pain and leg swelling.  Gastrointestinal: Negative for abdominal pain.  Genitourinary: Negative for decreased urine volume, discharge, penile pain, penile swelling, scrotal swelling, testicular pain and urgency.  Musculoskeletal: Negative for back pain and gait problem.  Skin: Negative for rash.  Neurological: Negative for dizziness, weakness and light-headedness.  All other systems reviewed and are negative.   Per HPI unless specifically indicated above   Allergies as of 02/14/2019      Reactions   Penicillins Other (See Comments)   Childhood illness       Medication List       Accurate as of February 14, 2019 11:33 AM. If you have any questions, ask your nurse or doctor.        STOP taking these medications   atorvastatin 20 MG tablet Commonly known as: LIPITOR Stopped by: Nils PyleJoshua A , MD   meloxicam 15 MG tablet Commonly known as: MOBIC Stopped by: Elige RadonJoshua A , MD     TAKE these medications   escitalopram 10 MG tablet Commonly known as: LEXAPRO Take 1 tablet (10 mg total) by mouth daily.   lisinopril-hydrochlorothiazide 20-12.5 MG tablet Commonly known as: Zestoretic Take 2 tablets by mouth daily.   omeprazole 20 MG capsule Commonly known as: PRILOSEC Take 1 capsule (20 mg total) by mouth daily.   rosuvastatin 40 MG tablet Commonly known as: Crestor Take 1 tablet (40 mg total) by mouth daily. What changed:   medication strength  how much to take  Changed by: Fransisca Kaufmann , MD   sildenafil 20 MG tablet Commonly known as: REVATIO Take 1-3 tablets (20-60 mg total) by mouth as needed. Started by: Worthy Rancher, MD   Vitamin D 125 MCG (5000 UT) Caps Take 1 capsule by mouth daily. Started by: Fransisca Kaufmann , MD        Objective:   BP 126/82   Pulse 69   Temp 98.4 F (36.9 C) (Temporal)   Ht 6\' 6"  (1.981 m)   Wt 253 lb 12.8 oz (115.1 kg)   BMI 29.33  kg/m   Wt Readings from Last 3 Encounters:  02/14/19 253 lb 12.8 oz (115.1 kg)  07/04/16 235 lb (106.6 kg)  04/14/16 237 lb 3.2 oz (107.6 kg)    Physical Exam Vitals signs and nursing note reviewed.  Constitutional:      General: He is not in acute distress.    Appearance: He is well-developed. He is not diaphoretic.  Eyes:     General: No scleral icterus.    Conjunctiva/sclera: Conjunctivae normal.  Neck:     Musculoskeletal: Neck supple.     Thyroid: No thyromegaly.  Cardiovascular:     Rate and Rhythm: Normal rate and regular rhythm.     Heart sounds: Normal heart sounds. No murmur.  Pulmonary:     Effort: Pulmonary effort is normal. No respiratory distress.     Breath sounds: Normal breath sounds. No wheezing.  Musculoskeletal: Normal range of motion.  Lymphadenopathy:     Cervical: No cervical adenopathy.  Skin:    General: Skin is warm and dry.     Findings: No rash.  Neurological:     Mental Status: He is alert and oriented to person, place, and time.     Coordination: Coordination normal.  Psychiatric:        Behavior: Behavior normal.     Assessment & Plan:   Problem List Items Addressed This Visit      Cardiovascular and Mediastinum   Essential hypertension   Relevant Medications   lisinopril-hydrochlorothiazide (ZESTORETIC) 20-12.5 MG tablet   rosuvastatin (CRESTOR) 40 MG tablet   sildenafil (REVATIO) 20 MG tablet     Digestive   GERD (gastroesophageal reflux disease)     Other   Hyperlipidemia - Primary   Relevant Medications   lisinopril-hydrochlorothiazide (ZESTORETIC) 20-12.5 MG tablet   rosuvastatin (CRESTOR) 40 MG tablet   sildenafil (REVATIO) 20 MG tablet    Other Visit Diagnoses    New onset type 2 diabetes mellitus (HCC)       Relevant Medications   lisinopril-hydrochlorothiazide (ZESTORETIC) 20-12.5 MG tablet   rosuvastatin (CRESTOR) 40 MG tablet   Erectile dysfunction, unspecified erectile dysfunction type       Relevant  Medications   sildenafil (REVATIO) 20 MG tablet   Vitamin D deficiency       Relevant Medications   Cholecalciferol (VITAMIN D) 125 MCG (5000 UT) CAPS      Sounds like is already making dietary changes for diabetes and stopping the sodas, will will continue with this diet and get some more education about diet.  Will increase Crestor to 40 mg and continue lisinopril hydrochlorothiazide  Start taking vitamin D daily Follow up plan: Return in about 2 months (around 04/17/2019), or if symptoms worsen or fail to improve, for Diabetes and hypertension recheck.  Counseling provided for all of the vaccine components No orders of the defined types were placed in this encounter.   Caryl Pina, MD [facility] 02/14/2019, 11:33 AM

## 2019-02-24 DIAGNOSIS — I739 Peripheral vascular disease, unspecified: Secondary | ICD-10-CM | POA: Diagnosis not present

## 2019-02-24 DIAGNOSIS — E119 Type 2 diabetes mellitus without complications: Secondary | ICD-10-CM | POA: Diagnosis not present

## 2019-02-24 DIAGNOSIS — M868X7 Other osteomyelitis, ankle and foot: Secondary | ICD-10-CM | POA: Diagnosis not present

## 2019-02-24 DIAGNOSIS — S95901A Unspecified injury of unspecified blood vessel at ankle and foot level, right leg, initial encounter: Secondary | ICD-10-CM | POA: Diagnosis not present

## 2019-02-24 DIAGNOSIS — B9561 Methicillin susceptible Staphylococcus aureus infection as the cause of diseases classified elsewhere: Secondary | ICD-10-CM | POA: Diagnosis not present

## 2019-02-24 DIAGNOSIS — M86071 Acute hematogenous osteomyelitis, right ankle and foot: Secondary | ICD-10-CM | POA: Diagnosis not present

## 2019-02-24 DIAGNOSIS — I10 Essential (primary) hypertension: Secondary | ICD-10-CM | POA: Diagnosis not present

## 2019-02-24 DIAGNOSIS — K219 Gastro-esophageal reflux disease without esophagitis: Secondary | ICD-10-CM | POA: Diagnosis not present

## 2019-02-24 DIAGNOSIS — J449 Chronic obstructive pulmonary disease, unspecified: Secondary | ICD-10-CM | POA: Diagnosis not present

## 2019-02-24 DIAGNOSIS — L03115 Cellulitis of right lower limb: Secondary | ICD-10-CM | POA: Diagnosis not present

## 2019-02-24 DIAGNOSIS — L03031 Cellulitis of right toe: Secondary | ICD-10-CM | POA: Diagnosis not present

## 2019-02-24 DIAGNOSIS — Z800 Family history of malignant neoplasm of digestive organs: Secondary | ICD-10-CM | POA: Diagnosis not present

## 2019-02-24 DIAGNOSIS — E7800 Pure hypercholesterolemia, unspecified: Secondary | ICD-10-CM | POA: Diagnosis not present

## 2019-02-24 DIAGNOSIS — E1169 Type 2 diabetes mellitus with other specified complication: Secondary | ICD-10-CM | POA: Diagnosis not present

## 2019-02-24 DIAGNOSIS — Z8249 Family history of ischemic heart disease and other diseases of the circulatory system: Secondary | ICD-10-CM | POA: Diagnosis not present

## 2019-02-24 DIAGNOSIS — Z89422 Acquired absence of other left toe(s): Secondary | ICD-10-CM | POA: Diagnosis not present

## 2019-02-24 DIAGNOSIS — M869 Osteomyelitis, unspecified: Secondary | ICD-10-CM | POA: Diagnosis not present

## 2019-02-24 DIAGNOSIS — M86171 Other acute osteomyelitis, right ankle and foot: Secondary | ICD-10-CM | POA: Diagnosis not present

## 2019-02-24 DIAGNOSIS — Z87891 Personal history of nicotine dependence: Secondary | ICD-10-CM | POA: Diagnosis not present

## 2019-02-24 DIAGNOSIS — Z880 Allergy status to penicillin: Secondary | ICD-10-CM | POA: Diagnosis not present

## 2019-02-25 ENCOUNTER — Encounter: Payer: Self-pay | Admitting: Family Medicine

## 2019-02-25 DIAGNOSIS — M86171 Other acute osteomyelitis, right ankle and foot: Secondary | ICD-10-CM | POA: Diagnosis not present

## 2019-03-03 ENCOUNTER — Telehealth: Payer: Self-pay | Admitting: Family

## 2019-03-03 DIAGNOSIS — Z89422 Acquired absence of other left toe(s): Secondary | ICD-10-CM | POA: Diagnosis not present

## 2019-03-03 DIAGNOSIS — I10 Essential (primary) hypertension: Secondary | ICD-10-CM | POA: Diagnosis not present

## 2019-03-03 DIAGNOSIS — M869 Osteomyelitis, unspecified: Secondary | ICD-10-CM | POA: Diagnosis not present

## 2019-03-03 DIAGNOSIS — R7303 Prediabetes: Secondary | ICD-10-CM | POA: Diagnosis not present

## 2019-03-03 DIAGNOSIS — M868X7 Other osteomyelitis, ankle and foot: Secondary | ICD-10-CM | POA: Diagnosis not present

## 2019-03-03 DIAGNOSIS — Z89421 Acquired absence of other right toe(s): Secondary | ICD-10-CM | POA: Diagnosis not present

## 2019-03-04 ENCOUNTER — Telehealth: Payer: Self-pay | Admitting: Family

## 2019-03-04 NOTE — Telephone Encounter (Signed)
He is aware, an appointment must be done in office, to get script for diabetic shoes.   He refused to schedule and said he may call back.

## 2019-03-04 NOTE — Telephone Encounter (Signed)
I haven't seen this patient in 3 years, looks like you have done his chronic follow up.

## 2019-03-04 NOTE — Telephone Encounter (Signed)
We would have to have a documented foot exam for that prescription and that would have to be done in his visit, we can do next visit.

## 2019-03-10 DIAGNOSIS — M868X7 Other osteomyelitis, ankle and foot: Secondary | ICD-10-CM | POA: Diagnosis not present

## 2019-03-10 DIAGNOSIS — R7303 Prediabetes: Secondary | ICD-10-CM | POA: Diagnosis not present

## 2019-03-10 DIAGNOSIS — I10 Essential (primary) hypertension: Secondary | ICD-10-CM | POA: Diagnosis not present

## 2019-03-10 DIAGNOSIS — Z89422 Acquired absence of other left toe(s): Secondary | ICD-10-CM | POA: Diagnosis not present

## 2019-03-10 DIAGNOSIS — Z89421 Acquired absence of other right toe(s): Secondary | ICD-10-CM | POA: Diagnosis not present

## 2019-03-17 DIAGNOSIS — I10 Essential (primary) hypertension: Secondary | ICD-10-CM | POA: Diagnosis not present

## 2019-03-17 DIAGNOSIS — R7303 Prediabetes: Secondary | ICD-10-CM | POA: Diagnosis not present

## 2019-03-17 DIAGNOSIS — M868X7 Other osteomyelitis, ankle and foot: Secondary | ICD-10-CM | POA: Diagnosis not present

## 2019-03-17 DIAGNOSIS — Z89421 Acquired absence of other right toe(s): Secondary | ICD-10-CM | POA: Diagnosis not present

## 2019-03-17 DIAGNOSIS — Z89422 Acquired absence of other left toe(s): Secondary | ICD-10-CM | POA: Diagnosis not present

## 2019-04-11 ENCOUNTER — Telehealth: Payer: Self-pay | Admitting: Family Medicine

## 2019-04-11 NOTE — Telephone Encounter (Signed)
Spoke with np at [facility] about patient and she has spoken with him and he has had a 2nd amputation and she saw him and his a1c was 6.4 and she is concerned about him. He is to come in Friday at 325 with [name]. He is having 2nd amputation.  He needs diabetic education.  Caryl Pina, MD [facility] 04/11/2019, 12:42 PM

## 2019-04-13 DIAGNOSIS — E785 Hyperlipidemia, unspecified: Secondary | ICD-10-CM | POA: Diagnosis not present

## 2019-04-13 DIAGNOSIS — E559 Vitamin D deficiency, unspecified: Secondary | ICD-10-CM | POA: Diagnosis not present

## 2019-04-13 DIAGNOSIS — Z139 Encounter for screening, unspecified: Secondary | ICD-10-CM | POA: Diagnosis not present

## 2019-04-13 DIAGNOSIS — Z79899 Other long term (current) drug therapy: Secondary | ICD-10-CM | POA: Diagnosis not present

## 2019-04-13 DIAGNOSIS — R7309 Other abnormal glucose: Secondary | ICD-10-CM | POA: Diagnosis not present

## 2019-04-13 DIAGNOSIS — I10 Essential (primary) hypertension: Secondary | ICD-10-CM | POA: Diagnosis not present

## 2019-04-15 ENCOUNTER — Other Ambulatory Visit: Payer: Self-pay

## 2019-04-15 ENCOUNTER — Ambulatory Visit: Payer: BC Managed Care – PPO | Admitting: Family Medicine

## 2019-04-15 ENCOUNTER — Encounter: Payer: Self-pay | Admitting: Family Medicine

## 2019-04-15 VITALS — BP 153/88 | HR 62 | Temp 98.0°F | Ht 78.0 in | Wt 252.6 lb

## 2019-04-15 DIAGNOSIS — E782 Mixed hyperlipidemia: Secondary | ICD-10-CM | POA: Diagnosis not present

## 2019-04-15 DIAGNOSIS — E1169 Type 2 diabetes mellitus with other specified complication: Secondary | ICD-10-CM | POA: Insufficient documentation

## 2019-04-15 DIAGNOSIS — L97522 Non-pressure chronic ulcer of other part of left foot with fat layer exposed: Secondary | ICD-10-CM

## 2019-04-15 DIAGNOSIS — Z89421 Acquired absence of other right toe(s): Secondary | ICD-10-CM

## 2019-04-15 DIAGNOSIS — Z89422 Acquired absence of other left toe(s): Secondary | ICD-10-CM | POA: Diagnosis not present

## 2019-04-15 DIAGNOSIS — E11621 Type 2 diabetes mellitus with foot ulcer: Secondary | ICD-10-CM

## 2019-04-15 MED ORDER — SULFAMETHOXAZOLE-TRIMETHOPRIM 800-160 MG PO TABS: 1.0000 | ORAL_TABLET | Freq: Two times a day (BID) | ORAL | 0 refills | Status: DC

## 2019-04-15 NOTE — Progress Notes (Signed)
BP (!) 153/88   Pulse 62   Temp 98 F (36.7 C) (Temporal)   Ht 6\' 6"  (1.981 m)   Wt 252 lb 9.6 oz (114.6 kg)   SpO2 100%   BMI 29.19 kg/m    Subjective:   Patient ID: Luis Ford, male    DOB: [birth year], 58 y.o.   MRN: [id number]  HPI: Luis Ford is a 58 y.o. male presenting on 04/15/2019 for Diabetes (2 month follow up)   HPI Type 2 diabetes mellitus Patient comes in today for recheck of his diabetes. Patient has been currently taking no medication for the diabetes. Patient is currently on an ACE inhibitor/ARB. Patient has not seen an ophthalmologist this year.  Patient is already had a partial amputation of 2 of his toes and now has a small sore on the other one.  He was recently up in the hospital for a second amputation on the other toe within the past couple months.  He has hammertoes and used to see a podiatrist for an old amputation of 1 of his toes on his left foot.  He just had blood work done at his work where his A1c was 6.7 which was up slightly from 6.4.  His triglycerides were also elevated and his creatinine was slightly elevated at 1.22.  Hyperlipidemia Patient is coming in for recheck of his hyperlipidemia. The patient is currently taking Crestor. They deny any issues with myalgias or history of liver damage from it. They deny any focal numbness or weakness or chest pain.   Relevant past medical, surgical, family and social history reviewed and updated as indicated. Interim medical history since our last visit reviewed. Allergies and medications reviewed and updated.  Review of Systems  Constitutional: Negative for chills and fever.  Respiratory: Negative for shortness of breath and wheezing.   Cardiovascular: Negative for chest pain and leg swelling.  Musculoskeletal: Negative for back pain and gait problem.  Skin: Positive for wound. Negative for color change and rash.  All other systems reviewed and are negative.   Per HPI unless specifically  indicated above   Allergies as of 04/15/2019      Reactions   Penicillins Other (See Comments)   Childhood illness       Medication List       Accurate as of April 15, 2019  3:29 PM. If you have any questions, ask your nurse or doctor.        escitalopram 10 MG tablet Commonly known as: LEXAPRO Take 1 tablet (10 mg total) by mouth daily.   lisinopril-hydrochlorothiazide 20-12.5 MG tablet Commonly known as: Zestoretic Take 2 tablets by mouth daily.   omeprazole 20 MG capsule Commonly known as: PRILOSEC Take 1 capsule (20 mg total) by mouth daily.   rosuvastatin 40 MG tablet Commonly known as: Crestor Take 1 tablet (40 mg total) by mouth daily.   sildenafil 20 MG tablet Commonly known as: REVATIO Take 1-3 tablets (20-60 mg total) by mouth as needed.   Vitamin D 125 MCG (5000 UT) Caps Take 1 capsule by mouth daily.        Objective:   BP (!) 153/88   Pulse 62   Temp 98 F (36.7 C) (Temporal)   Ht 6\' 6"  (1.981 m)   Wt 252 lb 9.6 oz (114.6 kg)   SpO2 100%   BMI 29.19 kg/m   Wt Readings from Last 3 Encounters:  04/15/19 252 lb 9.6 oz (114.6 kg)  02/14/19 253 lb  12.8 oz (115.1 kg)  07/04/16 235 lb (106.6 kg)    Physical Exam Vitals signs and nursing note reviewed.  Constitutional:      General: He is not in acute distress.    Appearance: He is well-developed. He is not diaphoretic.  Eyes:     General: No scleral icterus.    Conjunctiva/sclera: Conjunctivae normal.  Neck:     Musculoskeletal: Neck supple.     Thyroid: No thyromegaly.  Cardiovascular:     Rate and Rhythm: Normal rate and regular rhythm.     Heart sounds: Normal heart sounds. No murmur.  Pulmonary:     Effort: Pulmonary effort is normal. No respiratory distress.     Breath sounds: Normal breath sounds. No wheezing.  Lymphadenopathy:     Cervical: No cervical adenopathy.  Skin:    General: Skin is warm and dry.     Findings: No rash.  Neurological:     Mental Status: He is  alert and oriented to person, place, and time.     Coordination: Coordination normal.  Psychiatric:        Behavior: Behavior normal.     Diabetic Foot Exam - Simple   Simple Foot Form Diabetic Foot exam was performed with the following findings: Yes 04/15/2019  3:45 PM  Visual Inspection Sensation Testing Pulse Check Comments Concern for this ulceration of his third toe on the left foot where the amputation is between the 2 toes, will send for podiatry urgent referral      Assessment & Plan:   Problem List Items Addressed This Visit      Endocrine   Type 2 diabetes mellitus with other specified complication (HCC)   Relevant Orders   For home use only DME Other see comment   Diabetic foot ulcer (HCC)   Relevant Medications   sulfamethoxazole-trimethoprim (BACTRIM DS) 800-160 MG tablet   Other Relevant Orders   For home use only DME Other see comment   Ambulatory referral to Podiatry   Ambulatory referral to Vascular Surgery     Other   Hyperlipidemia - Primary    Other Visit Diagnoses    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 Follow up plan: Return in about 3 months (around 07/15/2019), or if symptoms worsen or fail to improve, for Diabetes recheck.  Counseling provided for all of the vaccine components No orders of the defined types were placed in this encounter.   Arville Care, MD [facility] 04/15/2019, 3:29 PM

## 2019-04-18 ENCOUNTER — Ambulatory Visit (INDEPENDENT_AMBULATORY_CARE_PROVIDER_SITE_OTHER): Payer: BC Managed Care – PPO | Admitting: Podiatry

## 2019-04-18 ENCOUNTER — Ambulatory Visit (INDEPENDENT_AMBULATORY_CARE_PROVIDER_SITE_OTHER): Payer: BC Managed Care – PPO

## 2019-04-18 ENCOUNTER — Encounter: Payer: Self-pay | Admitting: Podiatry

## 2019-04-18 ENCOUNTER — Other Ambulatory Visit: Payer: Self-pay

## 2019-04-18 VITALS — BP 157/87

## 2019-04-18 DIAGNOSIS — E13621 Other specified diabetes mellitus with foot ulcer: Secondary | ICD-10-CM

## 2019-04-18 DIAGNOSIS — L97529 Non-pressure chronic ulcer of other part of left foot with unspecified severity: Secondary | ICD-10-CM | POA: Diagnosis not present

## 2019-04-18 DIAGNOSIS — L97522 Non-pressure chronic ulcer of other part of left foot with fat layer exposed: Secondary | ICD-10-CM

## 2019-04-18 DIAGNOSIS — E0843 Diabetes mellitus due to underlying condition with diabetic autonomic (poly)neuropathy: Secondary | ICD-10-CM

## 2019-04-22 LAB — WOUND CULTURE
MICRO NUMBER:: 934335
SPECIMEN QUALITY:: ADEQUATE

## 2019-04-26 NOTE — Progress Notes (Signed)
   Subjective:  58 y.o. male with PMHx of diabetes mellitus presents today as a new patient for evaluation of ulcerations to the left third toe.  Patient states that he had the ulcer for approximately the past week with a sudden onset.  Aggravated by wearing shoes.  Modifying his shoe gear has alleviated the symptoms.  Patient has no pain associated with the ulcer.  He does have a history of bilateral partial toe amputations.   Past Medical History:  Diagnosis Date  . Asthma   . Hyperlipemia   . MRSA infection   . Toe amputee [facility])       Objective/Physical Exam General: The patient is alert and oriented x3 in no acute distress.  Dermatology:  Wound #1 noted to the distal stump of the left third toe amputation site.  Measures approximately 0.5 x 0.6 x 0.3 cm (LxWxD).   To the noted ulceration(s), there is no eschar. There is a moderate amount of slough, fibrin, and necrotic tissue noted. Granulation tissue and wound base is red. There is a minimal amount of serosanguineous drainage noted. There is no exposed bone muscle-tendon ligament or joint. There is no malodor. Periwound integrity is intact. Skin is warm, dry and supple bilateral lower extremities.  Vascular: Palpable pedal pulses bilaterally. No edema or erythema noted. Capillary refill within normal limits.  Neurological: Epicritic and protective threshold diminished bilaterally.   Musculoskeletal Exam: Range of motion within normal limits to all pedal and ankle joints bilateral. Muscle strength 5/5 in all groups bilateral.   Radiographic exam: Evidence of partial third toe amputation at the PIPJ.  Proximal phalanx appears intact without any evidence of degenerative changes or osteomyelitis diffuse degenerative joint changes noted throughout the midtarsal joint as well as the second MTPJ.  Assessment: 1.  Ulcer left third toe amputation stump secondary to diabetes mellitus 2. diabetes mellitus w/ peripheral neuropathy 3.   History of bilateral partial toe amputations   Plan of Care:  1. Patient was evaluated. 2. medically necessary excisional debridement including subcutaneous tissue was performed using a tissue nipper and a chisel blade. Excisional debridement of all the necrotic nonviable tissue down to healthy bleeding viable tissue was performed with post-debridement measurements same as pre-. 3. the wound was cleansed and dry sterile dressing applied. 4.  Cultures taken and sent to pathology 5.  Continue oral antibiotics as per PCP 6.  Recommend Betadine and Band-Aid daily to the ulceration site 7.  Return to clinic in 3 weeks   Edrick Kins, DPM [facility]  Dr. Edrick Kins, [address]                                        [place],  [address]                Office [phone]  Fax [phone]

## 2019-05-02 DIAGNOSIS — Z23 Encounter for immunization: Secondary | ICD-10-CM | POA: Diagnosis not present

## 2019-05-04 ENCOUNTER — Telehealth: Payer: Self-pay | Admitting: *Deleted

## 2019-05-04 NOTE — Telephone Encounter (Signed)
[facility] sent over urgent request for patient information and I listed the icd-10 codes that [facility] was needing for the culture that was done on 04-18-2019. Luis Ford

## 2019-05-11 ENCOUNTER — Ambulatory Visit: Payer: BC Managed Care – PPO | Admitting: Podiatry

## 2019-06-06 ENCOUNTER — Other Ambulatory Visit: Payer: Self-pay | Admitting: Family Medicine

## 2019-06-06 DIAGNOSIS — E559 Vitamin D deficiency, unspecified: Secondary | ICD-10-CM

## 2019-06-10 ENCOUNTER — Other Ambulatory Visit: Payer: Self-pay | Admitting: Family Medicine

## 2019-06-10 DIAGNOSIS — E559 Vitamin D deficiency, unspecified: Secondary | ICD-10-CM

## 2019-07-13 ENCOUNTER — Ambulatory Visit: Payer: BC Managed Care – PPO | Admitting: Family Medicine

## 2019-07-19 ENCOUNTER — Encounter: Payer: Self-pay | Admitting: Family Medicine

## 2019-08-09 ENCOUNTER — Other Ambulatory Visit: Payer: Self-pay | Admitting: Family Medicine

## 2019-08-09 DIAGNOSIS — I10 Essential (primary) hypertension: Secondary | ICD-10-CM

## 2019-10-19 ENCOUNTER — Encounter: Payer: Self-pay | Admitting: Family Medicine

## 2019-10-26 ENCOUNTER — Telehealth: Payer: Self-pay | Admitting: Family Medicine

## 2019-10-26 NOTE — Telephone Encounter (Signed)
Left message to call back  

## 2019-10-31 NOTE — Telephone Encounter (Signed)
No return call from patient to schedule an appointment.

## 2019-11-01 DIAGNOSIS — M79605 Pain in left leg: Secondary | ICD-10-CM | POA: Diagnosis not present

## 2019-11-01 DIAGNOSIS — E7800 Pure hypercholesterolemia, unspecified: Secondary | ICD-10-CM | POA: Diagnosis not present

## 2019-11-01 DIAGNOSIS — I10 Essential (primary) hypertension: Secondary | ICD-10-CM | POA: Diagnosis not present

## 2019-11-01 DIAGNOSIS — M25561 Pain in right knee: Secondary | ICD-10-CM | POA: Diagnosis not present

## 2019-11-01 DIAGNOSIS — R918 Other nonspecific abnormal finding of lung field: Secondary | ICD-10-CM | POA: Diagnosis not present

## 2019-11-01 DIAGNOSIS — M79604 Pain in right leg: Secondary | ICD-10-CM | POA: Diagnosis not present

## 2019-11-01 DIAGNOSIS — Z79899 Other long term (current) drug therapy: Secondary | ICD-10-CM | POA: Diagnosis not present

## 2019-11-15 ENCOUNTER — Other Ambulatory Visit: Payer: Self-pay

## 2019-11-15 ENCOUNTER — Ambulatory Visit (INDEPENDENT_AMBULATORY_CARE_PROVIDER_SITE_OTHER): Payer: BC Managed Care – PPO

## 2019-11-15 ENCOUNTER — Ambulatory Visit: Payer: BC Managed Care – PPO | Admitting: Family Medicine

## 2019-11-15 ENCOUNTER — Encounter: Payer: Self-pay | Admitting: Family Medicine

## 2019-11-15 VITALS — BP 125/79 | HR 77 | Temp 97.8°F | Ht 78.0 in | Wt 245.6 lb

## 2019-11-15 DIAGNOSIS — M25461 Effusion, right knee: Secondary | ICD-10-CM | POA: Diagnosis not present

## 2019-11-15 DIAGNOSIS — M25561 Pain in right knee: Secondary | ICD-10-CM | POA: Diagnosis not present

## 2019-11-15 MED ORDER — METHYLPREDNISOLONE ACETATE 80 MG/ML IJ SUSP
80.0000 mg | Freq: Once | INTRAMUSCULAR | Status: AC
  Administered 2019-11-15: 10:00:00 80 mg via INTRAMUSCULAR

## 2019-11-15 NOTE — Progress Notes (Signed)
Assessment & Plan:  1. Acute pain of right knee - Offered referral to ortho but patient wanted to try joint aspiration and injection first. Education provided on knee pain.  - DG Knee 1-2 Views Right; Future - methylPREDNISolone acetate (DEPO-MEDROL) injection 80 mg   Follow up plan: Return if symptoms worsen or fail to improve.  Hendricks Limes, MSN, APRN, FNP-C [facility]  Subjective:   Patient ID: Luis Ford, male    DOB: 11-Jul-1961, 59 y.o.   MRN: [id number]  HPI: Luis Ford is a 59 y.o. male presenting on 11/15/2019 for Knee Pain (right x 1 month )  Knee Pain: Patient presents with knee pain involving the  right knee. Onset of the symptoms was 2 months ago. Inciting event: none known. Current symptoms include pain located medially and stiffness. He reports the pain is constant; he describes it as aching like a toothache and rates the pain 10/10. Pain is aggravated by standing after periods of inactivity.  Patient has had no prior knee problems. Evaluation to date: none.  He was seen at [facility] on 11/01/2019 where they diagnosed him with a muscle strain and AKI. His GFR was down to 51 from 71 three months ago. Repeat on 11/09/19 was 67. Treatment to date: OTC analgesics which are ineffective.   ROS: Negative unless specifically indicated above in HPI.   Relevant past medical history reviewed and updated as indicated.   Allergies and medications reviewed and updated.   Current Outpatient Medications:  .  Cholecalciferol (VITAMIN D3) 125 MCG (5000 UT) CAPS, TAKE ONE CAPSULE BY MOUTH DAILY., Disp: 100 capsule, Rfl: 1 .  diclofenac Sodium (VOLTAREN) 1 % GEL, SMARTSIG:1 Inch(es) Topical 3 Times Daily, Disp: , Rfl:  .  escitalopram (LEXAPRO) 10 MG tablet, Take 1 tablet (10 mg total) by mouth daily., Disp: 90 tablet, Rfl: 1 .  lisinopril-hydrochlorothiazide (ZESTORETIC) 20-12.5 MG tablet, TAKE TWO (2) TABLETS BY MOUTH DAILY., Disp: 60 tablet,  Rfl: 0 .  metFORMIN (GLUCOPHAGE-XR) 500 MG 24 hr tablet, Take 1 tablet by mouth daily., Disp: , Rfl:  .  omeprazole (PRILOSEC) 20 MG capsule, Take 1 capsule (20 mg total) by mouth daily., Disp: 90 capsule, Rfl: 3 .  rosuvastatin (CRESTOR) 40 MG tablet, Take 1 tablet (40 mg total) by mouth daily., Disp: 90 tablet, Rfl: 3 .  sildenafil (REVATIO) 20 MG tablet, Take 1-3 tablets (20-60 mg total) by mouth as needed., Disp: 20 tablet, Rfl: 1  Allergies  Allergen Reactions  . Penicillins Other (See Comments)    Childhood illness     Objective:   BP 125/79   Pulse 77   Temp 97.8 F (36.6 C) (Temporal)   Ht 6\' 6"  (1.981 m)   Wt 245 lb 9.6 oz (111.4 kg)   SpO2 99%   BMI 28.38 kg/m    Physical Exam Vitals reviewed.  Constitutional:      General: He is not in acute distress.    Appearance: Normal appearance. He is not ill-appearing, toxic-appearing or diaphoretic.  HENT:     Head: Normocephalic and atraumatic.  Eyes:     General: No scleral icterus.       Right eye: No discharge.        Left eye: No discharge.     Conjunctiva/sclera: Conjunctivae normal.  Cardiovascular:     Rate and Rhythm: Normal rate and regular rhythm.     Heart sounds: Normal heart sounds. No murmur. No friction rub. No gallop.  Pulmonary:     Effort: Pulmonary effort is normal. No respiratory distress.     Breath sounds: Normal breath sounds. No stridor. No wheezing, rhonchi or rales.  Musculoskeletal:        General: Normal range of motion.     Cervical back: Normal range of motion.     Right knee: Effusion present. No deformity, erythema, ecchymosis or lacerations. Normal range of motion. Tenderness present over the medial joint line. Abnormal meniscus.  Skin:    General: Skin is warm and dry.  Neurological:     Mental Status: He is alert and oriented to person, place, and time. Mental status is at baseline.  Psychiatric:        Mood and Affect: Mood normal.        Behavior: Behavior normal.         Thought Content: Thought content normal.        Judgment: Judgment normal.    Joint Injection/Arthrocentesis  Date/Time: 11/15/2019 9:25 AM Performed by: Gwenlyn Fudge, FNP Authorized by: Gwenlyn Fudge, FNP  Indications: joint swelling and pain  Body area: knee Joint: right knee Local anesthesia used: yes  Anesthesia: Local anesthesia used: yes Local Anesthetic: topical anesthetic  Sedation: Patient sedated: no  Needle size: 22 G Ultrasound guidance: no Approach: medial Aspirate amount: 0 mL Methylprednisolone amount: 80 mg Patient tolerance: patient tolerated the procedure well with no immediate complications

## 2019-11-15 NOTE — Patient Instructions (Signed)

## 2019-12-20 ENCOUNTER — Other Ambulatory Visit: Payer: Self-pay | Admitting: Family Medicine

## 2019-12-20 DIAGNOSIS — I10 Essential (primary) hypertension: Secondary | ICD-10-CM

## 2020-01-25 ENCOUNTER — Encounter: Payer: Self-pay | Admitting: Family Medicine

## 2020-02-11 ENCOUNTER — Other Ambulatory Visit: Payer: Self-pay | Admitting: Family Medicine

## 2020-02-11 DIAGNOSIS — E559 Vitamin D deficiency, unspecified: Secondary | ICD-10-CM

## 2020-03-01 ENCOUNTER — Other Ambulatory Visit: Payer: Self-pay | Admitting: Family Medicine

## 2020-03-01 DIAGNOSIS — I10 Essential (primary) hypertension: Secondary | ICD-10-CM

## 2020-03-08 ENCOUNTER — Telehealth: Payer: Self-pay | Admitting: Family Medicine

## 2020-03-08 DIAGNOSIS — M25561 Pain in right knee: Secondary | ICD-10-CM

## 2020-03-08 NOTE — Telephone Encounter (Signed)
Patient was seen for acute right knee pain 11/15/2019 - please advise if referral should be placed

## 2020-03-08 NOTE — Telephone Encounter (Signed)
  REFERRAL REQUEST Telephone Note 03/08/2020  What type of referral do you need? orthopedic  Why do you need this referral? Right knee locking up  Have you been seen at our office for this problem? yes (Advise that they may need an appointment with their PCP before a referral can be done)  Is there a particular doctor or location that you prefer? Any, ASAP per pt  Patient notified that referrals can take up to a week or longer to process. If they haven't heard anything within a week they should call back and speak with the referral department.

## 2020-03-09 NOTE — Telephone Encounter (Signed)
Our visit for his knee was almost 4 months ago. If he is unable to work he needs to be seen for this by either [name], urgent care, or ortho.

## 2020-03-09 NOTE — Telephone Encounter (Signed)
Referral placed.

## 2020-03-09 NOTE — Telephone Encounter (Signed)
Appointment scheduled.

## 2020-03-09 NOTE — Telephone Encounter (Signed)
Pt aware referral placed but pt states he needs a note for work as he can't work.

## 2020-03-13 ENCOUNTER — Encounter: Payer: Self-pay | Admitting: Family

## 2020-03-13 ENCOUNTER — Ambulatory Visit (INDEPENDENT_AMBULATORY_CARE_PROVIDER_SITE_OTHER): Payer: BC Managed Care – PPO | Admitting: Family

## 2020-03-13 DIAGNOSIS — G8929 Other chronic pain: Secondary | ICD-10-CM | POA: Diagnosis not present

## 2020-03-13 DIAGNOSIS — M25561 Pain in right knee: Secondary | ICD-10-CM | POA: Diagnosis not present

## 2020-03-13 MED ORDER — DICLOFENAC SODIUM 1 % EX GEL: CUTANEOUS | 2 refills | Status: DC

## 2020-03-13 MED ORDER — DICLOFENAC SODIUM 75 MG PO TBEC: 75.0000 mg | DELAYED_RELEASE_TABLET | Freq: Two times a day (BID) | ORAL | 0 refills | Status: DC

## 2020-03-13 NOTE — Progress Notes (Signed)
   Virtual Visit via telephone Note Due to COVID-19 pandemic this visit was conducted virtually. This visit type was conducted due to national recommendations for restrictions regarding the COVID-19 Pandemic (e.g. social distancing, sheltering in place) in an effort to limit this patient's exposure and mitigate transmission in our community. All issues noted in this document were discussed and addressed.  A physical exam was not performed with this format.  I connected with Luis Ford on 03/13/20 at 8:43 AM by telephone and verified that I am speaking with the correct person using two identifiers. Luis Ford is currently located at home and no one is currently with him during visit. The provider, Jannifer Rodney, FNP is located in their office at time of visit.  I discussed the limitations, risks, security and privacy concerns of performing an evaluation and management service by telephone and the availability of in person appointments. I also discussed with the patient that there may be a patient responsible charge related to this service. The patient expressed understanding and agreed to proceed.   History and Present Illness:  PT calls the office with worsening right knee pain. He was seen on 11/15/19 and had a knee injection. He states on 03/08/20 he twisted his knee wrong at work and has not been able to work. He called the office and a referral placed to Ortho.  Knee Pain  The incident occurred more than 1 week ago. There was no injury mechanism. The pain is present in the right knee. The pain is at a severity of 9/10. The pain is moderate. The pain has been constant since onset. Pertinent negatives include no muscle weakness or numbness. He reports no foreign bodies present. The symptoms are aggravated by weight bearing and movement. He has tried acetaminophen, NSAIDs and non-weight bearing for the symptoms. The treatment provided mild relief.      Review of Systems  Neurological:  Negative for numbness.     Observations/Objective: No SOB or distress noted   Assessment and Plan: 1. Chronic pain of right knee Ortho referral pending Ice Start Diclofenac  Needs PT and will more than likely need CT Work note given  - Ambulatory referral to Orthopedic Surgery - diclofenac Sodium (VOLTAREN) 1 % GEL; SMARTSIG:1 Inch(es) Topical 3 Times Daily  Dispense: 350 g; Refill: 2 - diclofenac (VOLTAREN) 75 MG EC tablet; Take 1 tablet (75 mg total) by mouth 2 (two) times daily.  Dispense: 30 tablet; Refill: 0     I discussed the assessment and treatment plan with the patient. The patient was provided an opportunity to ask questions and all were answered. The patient agreed with the plan and demonstrated an understanding of the instructions.   The patient was advised to call back or seek an in-person evaluation if the symptoms worsen or if the condition fails to improve as anticipated.  The above assessment and management plan was discussed with the patient. The patient verbalized understanding of and has agreed to the management plan. Patient is aware to call the clinic if symptoms persist or worsen. Patient is aware when to return to the clinic for a follow-up visit. Patient educated on when it is appropriate to go to the emergency department.   Time call ended:  9:00 AM  I provided 17 minutes of non-face-to-face time during this encounter.    Jannifer Rodney, FNP

## 2020-03-19 ENCOUNTER — Encounter: Payer: Self-pay | Admitting: Orthopedic Surgery

## 2020-03-20 ENCOUNTER — Encounter: Payer: Self-pay | Admitting: Orthopaedic Surgery

## 2020-03-20 ENCOUNTER — Other Ambulatory Visit: Payer: Self-pay

## 2020-03-20 ENCOUNTER — Ambulatory Visit (INDEPENDENT_AMBULATORY_CARE_PROVIDER_SITE_OTHER): Payer: BC Managed Care – PPO | Admitting: Orthopaedic Surgery

## 2020-03-20 VITALS — BP 172/97 | HR 69 | Ht 78.0 in | Wt 245.0 lb

## 2020-03-20 DIAGNOSIS — M25561 Pain in right knee: Secondary | ICD-10-CM

## 2020-03-20 DIAGNOSIS — G8929 Other chronic pain: Secondary | ICD-10-CM | POA: Diagnosis not present

## 2020-03-20 MED ORDER — HYDROCODONE-ACETAMINOPHEN 5-325 MG PO TABS: ORAL_TABLET | ORAL | 0 refills | Status: DC

## 2020-03-20 NOTE — Patient Instructions (Signed)
Out of work 

## 2020-03-20 NOTE — Progress Notes (Signed)
Subjective:    Patient ID: Luis Ford, male    DOB: [birth year], 59 y.o.   MRN: [id number]  HPI He has had pain in the right knee for about five months.  Four months ago he went to his family doctor and had x-rays and injection done of the right knee on 11-15-2019.  I have reviewed the notes and the x-rays.  I have independently reviewed and interpreted x-rays of this patient done at another site by another physician or qualified health professional.  He did well for a little while but has had swelling and popping in the right knee.  On 03-08-2020 it popped and gave way.  He has been unable to walk well since then.  He has used crutches.  He had virtual visit with his family doctor and was sent here.  He has no injury.  Nothing seems to help.   I am concerned about meniscus tear.  I will get MRI.   Review of Systems  Constitutional: Positive for activity change.  Respiratory: Positive for shortness of breath.   Musculoskeletal: Positive for arthralgias, gait problem and joint swelling.  All other systems reviewed and are negative.  For Review of Systems, all other systems reviewed and are negative.  The following is a summary of the past history medically, past history surgically, known current medicines, social history and family history.  This information is gathered electronically by the computer from prior information and documentation.  I review this each visit and have found including this information at this point in the chart is beneficial and informative.   Past Medical History:  Diagnosis Date  . Asthma   . Hyperlipemia   . MRSA infection   . Toe amputee [facility])     Past Surgical History:  Procedure Laterality Date  . APPENDECTOMY    . COLONOSCOPY  2003   [facility]-normal  . COLONOSCOPY  04/15/2012   Procedure: COLONOSCOPY;  Surgeon: Corbin Ade, MD;  [facility];  Service: Endoscopy;  Laterality: N/A;  8:45  . HERNIA REPAIR     right inguinal  . TOE  AMPUTATION    . TRACHEOSTOMY     infant/closure as child    Current Outpatient Medications on File Prior to Visit  Medication Sig Dispense Refill  . Cholecalciferol (VITAMIN D3) 125 MCG (5000 UT) CAPS TAKE ONE CAPSULE BY MOUTH DAILY. 100 capsule 1  . diclofenac (VOLTAREN) 75 MG EC tablet Take 1 tablet (75 mg total) by mouth 2 (two) times daily. 30 tablet 0  . diclofenac Sodium (VOLTAREN) 1 % GEL SMARTSIG:1 Inch(es) Topical 3 Times Daily 350 g 2  . escitalopram (LEXAPRO) 10 MG tablet Take 1 tablet (10 mg total) by mouth daily. 90 tablet 1  . lisinopril-hydrochlorothiazide (ZESTORETIC) 20-12.5 MG tablet Take 2 tablets by mouth daily. Needs to be seen for further refills. 60 tablet 0  . metFORMIN (GLUCOPHAGE-XR) 500 MG 24 hr tablet Take 1 tablet by mouth daily.    Marland Kitchen omeprazole (PRILOSEC) 20 MG capsule Take 1 capsule (20 mg total) by mouth daily. 90 capsule 3  . rosuvastatin (CRESTOR) 40 MG tablet Take 1 tablet (40 mg total) by mouth daily. 90 tablet 3  . sildenafil (REVATIO) 20 MG tablet Take 1-3 tablets (20-60 mg total) by mouth as needed. 20 tablet 1   No current facility-administered medications on file prior to visit.    Social History   Socioeconomic History  . Marital status: Single    Spouse name:  Not on file  . Number of children: 2  . Years of education: Not on file  . Highest education level: Not on file  Occupational History  . Occupation: Tour manager: [employer]  Tobacco Use  . Smoking status: Never Smoker  . Smokeless tobacco: Never Used  Substance and Sexual Activity  . Alcohol use: No  . Drug use: No  . Sexual activity: Not on file  Other Topics Concern  . Not on file  Social History Narrative   2 healthy sons    Lives w/ girlfriend   Social Determinants of Health   Financial Resource Strain:   . Difficulty of Paying Living Expenses: Not on file  Food Insecurity:   . Worried About Programme researcher, broadcasting/film/video in the Last Year: Not on file  . Ran Out of  Food in the Last Year: Not on file  Transportation Needs:   . Lack of Transportation (Medical): Not on file  . Lack of Transportation (Non-Medical): Not on file  Physical Activity:   . Days of Exercise per Week: Not on file  . Minutes of Exercise per Session: Not on file  Stress:   . Feeling of Stress : Not on file  Social Connections:   . Frequency of Communication with Friends and Family: Not on file  . Frequency of Social Gatherings with Friends and Family: Not on file  . Attends Religious Services: Not on file  . Active Member of Clubs or Organizations: Not on file  . Attends Banker Meetings: Not on file  . Marital Status: Not on file  Intimate Partner Violence:   . Fear of Current or Ex-Partner: Not on file  . Emotionally Abused: Not on file  . Physically Abused: Not on file  . Sexually Abused: Not on file    Family History  Problem Relation Age of Onset  . Coronary artery disease Mother   . Colon cancer Brother 51    BP (!) 172/97   Pulse 69   Ht 6\' 6"  (1.981 m)   Wt 245 lb (111.1 kg)   BMI 28.31 kg/m   Body mass index is 28.31 kg/m.     Objective:   Physical Exam Vitals and nursing note reviewed.  Constitutional:      Appearance: He is well-developed.  HENT:     Head: Normocephalic and atraumatic.  Eyes:     Conjunctiva/sclera: Conjunctivae normal.     Pupils: Pupils are equal, round, and reactive to light.  Cardiovascular:     Rate and Rhythm: Normal rate and regular rhythm.  Pulmonary:     Effort: Pulmonary effort is normal.  Abdominal:     Palpations: Abdomen is soft.  Musculoskeletal:     Cervical back: Normal range of motion and neck supple.       Legs:  Skin:    General: Skin is warm and dry.  Neurological:     Mental Status: He is alert and oriented to person, place, and time.     Cranial Nerves: No cranial nerve deficit.     Motor: No abnormal muscle tone.     Coordination: Coordination normal.     Deep Tendon Reflexes:  Reflexes are normal and symmetric. Reflexes normal.  Psychiatric:        Behavior: Behavior normal.        Thought Content: Thought content normal.        Judgment: Judgment normal.  Assessment & Plan:   Encounter Diagnosis  Name Primary?  . Chronic pain of right knee Yes   PROCEDURE NOTE:  The patient request injection, verbal consent was obtained.  The right knee was prepped appropriately after time out was performed.   Sterile technique was observed and anesthesia was provided by ethyl chloride and a 20-gauge needle was used to inject the knee area.  A 16-gauge needle was then used to aspirate the knee.  Color of fluid aspirated was blood tinged  Total cc's aspirated was 30.    Injection of 1 cc of Depo-Medrol 40 mg with several cc's of plain xylocaine was then performed.  A band aid dressing was applied.  The patient was advised to apply ice later today and tomorrow to the injection sight as needed.  I will give pain medicine.  I will order MRI.  Stay out of work.  Return in two weeks.  Call if any problem.  Precautions discussed.   Electronically Signed Darreld Mclean, MD 8/31/20211:57 PM

## 2020-03-20 NOTE — Progress Notes (Signed)
MRI

## 2020-03-29 ENCOUNTER — Telehealth: Payer: Self-pay | Admitting: Orthopaedic Surgery

## 2020-03-29 NOTE — Telephone Encounter (Signed)
Called patient to relay forms have been received in fax from his short-term disability insurer; discussed [employer] forms process; understands.

## 2020-04-03 ENCOUNTER — Ambulatory Visit: Payer: BC Managed Care – PPO | Admitting: Orthopaedic Surgery

## 2020-04-06 ENCOUNTER — Telehealth: Payer: Self-pay | Admitting: Orthopaedic Surgery

## 2020-04-06 NOTE — Telephone Encounter (Signed)
On 04/05/20, 5:19pm I called/notified patient's short term disability insurer [employer] at [phone], ext [phone], and left a message for claims examiner Alfonzo Feller, that our office had faxed forms to [phone]. On 04/06/20, approximately 10:55am, patient called - relayed status.  Patient expressed concern that it is taking a lot of time - reviewed process and timeline with patient; he did not voice full understanding.

## 2020-04-10 ENCOUNTER — Other Ambulatory Visit: Payer: Self-pay

## 2020-04-10 ENCOUNTER — Ambulatory Visit (HOSPITAL_COMMUNITY)
Admission: RE | Admit: 2020-04-10 | Discharge: 2020-04-10 | Disposition: A | Discharge status: Home / Self Care | Payer: BC Managed Care – PPO | Source: Ambulatory Visit | Attending: Orthopaedic Surgery | Admitting: Orthopaedic Surgery

## 2020-04-10 DIAGNOSIS — M25561 Pain in right knee: Secondary | ICD-10-CM | POA: Insufficient documentation

## 2020-04-10 DIAGNOSIS — S83231A Complex tear of medial meniscus, current injury, right knee, initial encounter: Secondary | ICD-10-CM | POA: Diagnosis not present

## 2020-04-10 DIAGNOSIS — M25461 Effusion, right knee: Secondary | ICD-10-CM | POA: Diagnosis not present

## 2020-04-10 DIAGNOSIS — M1711 Unilateral primary osteoarthritis, right knee: Secondary | ICD-10-CM | POA: Diagnosis not present

## 2020-04-10 DIAGNOSIS — G8929 Other chronic pain: Secondary | ICD-10-CM | POA: Diagnosis not present

## 2020-04-12 ENCOUNTER — Other Ambulatory Visit: Payer: Self-pay

## 2020-04-12 ENCOUNTER — Encounter: Payer: Self-pay | Admitting: Orthopaedic Surgery

## 2020-04-12 ENCOUNTER — Ambulatory Visit (INDEPENDENT_AMBULATORY_CARE_PROVIDER_SITE_OTHER): Payer: BC Managed Care – PPO | Admitting: Orthopaedic Surgery

## 2020-04-12 DIAGNOSIS — G8929 Other chronic pain: Secondary | ICD-10-CM

## 2020-04-12 DIAGNOSIS — M25561 Pain in right knee: Secondary | ICD-10-CM | POA: Diagnosis not present

## 2020-04-12 NOTE — Progress Notes (Signed)
Schedule for knee appointment to talk about surgery with Dr. Dallas Schimke.   09/24 /21 at 10:10.

## 2020-04-12 NOTE — Progress Notes (Signed)
Patient Luis Ford, male DOB:[birth year], 59 y.o. JKD:[id number]  Chief Complaint  Patient presents with  . Knee Pain    review MRI knee    HPI  Luis Ford is a 59 y.o. male who has knee pain on the right. He had MRI which showed: IMPRESSION: 1. Degenerated and torn medial meniscus all along the midbody region. 2. Intact ligamentous structures. 3. Tricompartmental degenerative changes most significant in the medial compartment. 4. Suspect small subchondral stress fracture involving the medial tibial plateau. 5. Moderate-sized joint effusion. Medial patellar plica noted.  I have explained the findings to him.  I have recommended arthroscopy of the knee.  He is agreeable to this.  I have independently reviewed the MRI.    I have independently reviewed the MRI.       There is no height or weight on file to calculate BMI.  ROS  Review of Systems  All other systems reviewed and are negative.  The following is a summary of the past history medically, past history surgically, known current medicines, social history and family history.  This information is gathered electronically by the computer from prior information and documentation.  I review this each visit and have found including this information at this point in the chart is beneficial and informative.    Past Medical History:  Diagnosis Date  . Asthma   . Hyperlipemia   . MRSA infection   . Toe amputee [facility])     Past Surgical History:  Procedure Laterality Date  . APPENDECTOMY    . COLONOSCOPY  2003   [facility]-normal  . COLONOSCOPY  04/15/2012   Procedure: COLONOSCOPY;  Surgeon: Corbin Ade, MD;  Location[facility];  Service: Endoscopy;  Laterality: N/A;  8:45  . HERNIA REPAIR     right inguinal  . TOE AMPUTATION    . TRACHEOSTOMY     infant/closure as child    Family History  Problem Relation Age of Onset  . Coronary artery disease Mother   . Colon cancer Brother 59    Social  History Social History   Tobacco Use  . Smoking status: Never Smoker  . Smokeless tobacco: Never Used  Substance Use Topics  . Alcohol use: No  . Drug use: No    Allergies  Allergen Reactions  . Penicillins Other (See Comments)    Childhood illness     Current Outpatient Medications  Medication Sig Dispense Refill  . Cholecalciferol (VITAMIN D3) 125 MCG (5000 UT) CAPS TAKE ONE CAPSULE BY MOUTH DAILY. 100 capsule 1  . diclofenac (VOLTAREN) 75 MG EC tablet Take 1 tablet (75 mg total) by mouth 2 (two) times daily. 30 tablet 0  . diclofenac Sodium (VOLTAREN) 1 % GEL SMARTSIG:1 Inch(es) Topical 3 Times Daily 350 g 2  . escitalopram (LEXAPRO) 10 MG tablet Take 1 tablet (10 mg total) by mouth daily. 90 tablet 1  . HYDROcodone-acetaminophen (NORCO/VICODIN) 5-325 MG tablet One tablet every four hours as needed for acute pain.  Limit of five days per [place] statue. 30 tablet 0  . lisinopril-hydrochlorothiazide (ZESTORETIC) 20-12.5 MG tablet Take 2 tablets by mouth daily. Needs to be seen for further refills. 60 tablet 0  . metFORMIN (GLUCOPHAGE-XR) 500 MG 24 hr tablet Take 1 tablet by mouth daily.    Marland Kitchen omeprazole (PRILOSEC) 20 MG capsule Take 1 capsule (20 mg total) by mouth daily. 90 capsule 3  . rosuvastatin (CRESTOR) 40 MG tablet Take 1 tablet (40 mg total) by  mouth daily. 90 tablet 3  . sildenafil (REVATIO) 20 MG tablet Take 1-3 tablets (20-60 mg total) by mouth as needed. 20 tablet 1   No current facility-administered medications for this visit.     Physical Exam  There were no vitals taken for this visit.  Constitutional: overall normal hygiene, normal nutrition, well developed, normal grooming, normal body habitus. Assistive device:none  Musculoskeletal: gait and station Limp right, muscle tone and strength are normal, no tremors or atrophy is present.  .  Neurological: coordination overall normal.  Deep tendon reflex/nerve stretch intact.  Sensation normal.  Cranial nerves  II-XII intact.   Skin:   Normal overall no scars, lesions, ulcers or rashes. No psoriasis.  Psychiatric: Alert and oriented x 3.  Recent memory intact, remote memory unclear.  Normal mood and affect. Well groomed.  Good eye contact.  Cardiovascular: overall no swelling, no varicosities, no edema bilaterally, normal temperatures of the legs and arms, no clubbing, cyanosis and good capillary refill.  Lymphatic: palpation is normal.  Right knee is tender, crepitus, effusion, ROM 0 to 110, positive medial [place], [place] intact, limp right. All other systems reviewed and are negative   The patient has been educated about the nature of the problem(s) and counseled on treatment options.  The patient appeared to understand what I have discussed and is in agreement with it.  Encounter Diagnosis  Name Primary?  . Chronic pain of right knee Yes    PLAN Call if any problems.  Precautions discussed.  Continue current medications.   Return to clinic For arthroscopy evaluation, Dr. Dallas Schimke.   Electronically Signed Darreld Mclean, MD 9/23/202112:37 PM

## 2020-04-13 ENCOUNTER — Encounter: Payer: Self-pay | Admitting: Orthopedic Surgery

## 2020-04-13 ENCOUNTER — Telehealth: Payer: Self-pay | Admitting: Orthopedic Surgery

## 2020-04-13 ENCOUNTER — Ambulatory Visit (INDEPENDENT_AMBULATORY_CARE_PROVIDER_SITE_OTHER): Payer: BC Managed Care – PPO | Admitting: Orthopedic Surgery

## 2020-04-13 VITALS — BP 152/100 | HR 99 | Ht 78.0 in

## 2020-04-13 DIAGNOSIS — M1711 Unilateral primary osteoarthritis, right knee: Secondary | ICD-10-CM | POA: Diagnosis not present

## 2020-04-13 NOTE — Patient Instructions (Signed)
Ok to return to work, effective immediately  Follow up in clinic as needed

## 2020-04-13 NOTE — Telephone Encounter (Signed)
Faxed work note as requested (signed authorization on file) to fax#[phone], [employer], attention Clent Jacks, nurse.

## 2020-04-13 NOTE — Progress Notes (Signed)
New Patient Visit  Assessment: Luis Ford is a 59 y.o. 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  I reviewed the x-rays and the available MRI in great detail.  He does have some advanced degenerative changes within his right knee, especially within the medial compartment.  This is where he localizes his pain.  Based on the level of arthritis he would not benefit from knee arthroscopy.  I explained to him, that there is a strong potential to make his symptoms worse.  In addition, he has had a steroid injection within the last month, and this would put him at greater risk for complications associated with any knee surgery.  I have advised him to continue with his activities as tolerated.  Topical medications are appropriate.  If he continues to have issues in the future, he could return for another steroid injection, and I am happy to provide this for him.  He can also return to see Dr. Hilda Lias for another steroid injection.  If his symptoms worsen, we can consider evaluation by Dr. Romeo Apple with consideration for total knee arthroplasty.  This was briefly discussed in clinic today, the patient is not interested at this time.  He can return to work effective immediately.  I have advised him to remain as active as possible.   Follow-up: Return if symptoms worsen or fail to improve.   Subjective:  Chief Complaint  Patient presents with  . Knee Pain    right knee pain,     History of Present Illness: Luis Ford is a 59 y.o. male who presents for evaluation of his right knee pain.  He has been taken care of by my partner Dr. Hilda Lias, who ordered an MRI which did demonstrate some concerning findings.  He presents today for further discussion, including the possibility of surgery.  Briefly, he has been having pain in his right knee for several months.  No  specific onset or injury.  The pain is primarily located in the medial aspect of the knee.  It was previously swollen but this is improved.  He has been unable to work due to the pain in his knee.  He has required a cane or crutch to assist with ambulation.  He has previously had an injection by his family doctor.  He states that there is some popping and catching in his right knee.  Otherwise, he denies specific mechanical symptoms.  Review of Systems: No numbness or tingling No fevers or chills No chest pain No shortness of breath  Medical History:  Past Medical History:  Diagnosis Date  . Asthma   . Hyperlipemia   . MRSA infection   . Toe amputee [facility])     Past Surgical History:  Procedure Laterality Date  . APPENDECTOMY    . COLONOSCOPY  2003   [facility]-normal  . COLONOSCOPY  04/15/2012   Procedure: COLONOSCOPY;  Surgeon: Corbin Ade, MD;  [facility];  Service: Endoscopy;  Laterality: N/A;  8:45  . HERNIA REPAIR     right inguinal  . TOE AMPUTATION    . TRACHEOSTOMY     infant/closure as child    Family History  Problem Relation Age of Onset  . Coronary artery disease Mother   . Colon cancer Brother 55   Social History   Tobacco Use  . Smoking status: Never Smoker  .  Smokeless tobacco: Never Used  Substance Use Topics  . Alcohol use: No  . Drug use: No    Allergies  Allergen Reactions  . Penicillins Other (See Comments)    Childhood illness     Current Meds  Medication Sig  . Cholecalciferol (VITAMIN D3) 125 MCG (5000 UT) CAPS TAKE ONE CAPSULE BY MOUTH DAILY.  Marland Kitchen diclofenac (VOLTAREN) 75 MG EC tablet Take 1 tablet (75 mg total) by mouth 2 (two) times daily.  . diclofenac Sodium (VOLTAREN) 1 % GEL SMARTSIG:1 Inch(es) Topical 3 Times Daily  . escitalopram (LEXAPRO) 10 MG tablet Take 1 tablet (10 mg total) by mouth daily.  Marland Kitchen HYDROcodone-acetaminophen (NORCO/VICODIN) 5-325 MG tablet One tablet every four hours as needed for acute pain.  Limit of  five days per [place] statue.  Marland Kitchen lisinopril-hydrochlorothiazide (ZESTORETIC) 20-12.5 MG tablet Take 2 tablets by mouth daily. Needs to be seen for further refills.  . metFORMIN (GLUCOPHAGE-XR) 500 MG 24 hr tablet Take 1 tablet by mouth daily.  Marland Kitchen omeprazole (PRILOSEC) 20 MG capsule Take 1 capsule (20 mg total) by mouth daily.  . rosuvastatin (CRESTOR) 40 MG tablet Take 1 tablet (40 mg total) by mouth daily.  . sildenafil (REVATIO) 20 MG tablet Take 1-3 tablets (20-60 mg total) by mouth as needed.    Objective: BP (!) 152/100   Pulse 99   Ht 6\' 6"  (1.981 m)   BMI 28.31 kg/m   Physical Exam:  Alert and oriented no acute distress. Walks with a right-sided antalgic gait, using a walking stick to assist with ambulation.  Evaluation the right knee demonstrates mild varus alignment. Mild effusion is appreciated. Range of motion is from 5 to 120 degrees with some pain at extremes of flexion. Tenderness palpation along medial joint line. Negative Lachman. No increased instability varus and valgus stress.  Evaluation of the left knee demonstrates neutral alignment.  No effusion is appreciated. Range of motion is full from 0 to 120 degrees with minimal discomfort. No tenderness palpation along the medial or lateral joint line. Negative Lachman No increased instability with varus and valgus stress.   IMAGING: I personally reviewed the following images:  X-rays of the right knee demonstrate almost complete loss of joint space in the medial compartment.  Mild varus alignment.  There are some osteophytes within all 3 compartments, including the superior aspect of the patella.  MRI of the right knee was reviewed in clinic today and demonstrates almost complete loss of cartilage within the medial compartment.  There are some underlying bony edema.  Degenerative meniscus changes are noted within the medial meniscus, specifically within the body.    No orders of the defined types were placed  in this encounter.     , MD  04/13/2020 9:42 AM

## 2020-04-20 DIAGNOSIS — E119 Type 2 diabetes mellitus without complications: Secondary | ICD-10-CM | POA: Diagnosis not present

## 2020-04-20 DIAGNOSIS — K047 Periapical abscess without sinus: Secondary | ICD-10-CM | POA: Diagnosis not present

## 2020-04-20 DIAGNOSIS — E7800 Pure hypercholesterolemia, unspecified: Secondary | ICD-10-CM | POA: Diagnosis not present

## 2020-04-20 DIAGNOSIS — Z880 Allergy status to penicillin: Secondary | ICD-10-CM | POA: Diagnosis not present

## 2020-04-20 DIAGNOSIS — I10 Essential (primary) hypertension: Secondary | ICD-10-CM | POA: Diagnosis not present

## 2020-05-02 DIAGNOSIS — Z23 Encounter for immunization: Secondary | ICD-10-CM | POA: Diagnosis not present

## 2020-05-31 ENCOUNTER — Other Ambulatory Visit: Payer: Self-pay | Admitting: Family Medicine

## 2020-05-31 DIAGNOSIS — E559 Vitamin D deficiency, unspecified: Secondary | ICD-10-CM

## 2020-09-12 ENCOUNTER — Encounter: Payer: Self-pay | Admitting: Orthopedic Surgery

## 2020-09-12 ENCOUNTER — Ambulatory Visit: Payer: BC Managed Care – PPO

## 2020-09-12 ENCOUNTER — Other Ambulatory Visit: Payer: Self-pay | Admitting: Orthopedic Surgery

## 2020-09-12 ENCOUNTER — Other Ambulatory Visit: Payer: Self-pay

## 2020-09-12 ENCOUNTER — Ambulatory Visit: Payer: BC Managed Care – PPO | Admitting: Orthopedic Surgery

## 2020-09-12 VITALS — BP 151/106 | HR 89 | Ht 78.0 in

## 2020-09-12 DIAGNOSIS — G8929 Other chronic pain: Secondary | ICD-10-CM

## 2020-09-12 DIAGNOSIS — M1711 Unilateral primary osteoarthritis, right knee: Secondary | ICD-10-CM

## 2020-09-12 DIAGNOSIS — M25561 Pain in right knee: Secondary | ICD-10-CM

## 2020-09-12 NOTE — Patient Instructions (Addendum)
[facility] See primary care for preop and HgA1c    Total Knee Replacement  Total knee replacement is a surgery to replace a bad knee joint with a man-made joint. The man-made joint is called a prosthesis. This joint may be made of plastic, metal, or ceramic parts. It replaces parts of the thigh bone, lower leg bone, and kneecap. Tell your doctor about:  Any allergies you have.  All medicines you are taking. This includes vitamins, herbs, eye drops, creams, and over-the-counter medicines.  Any problems you or family members have had with medicines that make you fall asleep (anesthetics).  Any blood disorders you have.  Any surgeries you have had.  Any medical conditions you have.  Whether you are pregnant or may be pregnant. What are the risks? Generally, this is a safe procedure. But, problems may occur, such as:  Infection.  Bleeding.  Blood clot.  Allergies from medicines.  Damage to nerves and other parts of the knee.  Problems with the knee, such as: ? Not being able to move your knee. ? A feeling that the knee is weak or unstable. ? Loosening of the new joint. ? Pain that does not go away. What happens before the procedure? Staying hydrated Follow instructions from your doctor about hydration. These may include:  Up to 2 hours before the procedure - you may continue to drink clear liquids. These include water, clear fruit juice, black coffee, and plain tea.   Eating and drinking restrictions Follow instructions from your doctor about eating and drinking. These may include:  8 hours before the procedure - stop eating heavy meals or foods. These include meat, fried foods, or fatty foods.  6 hours before the procedure - stop eating light meals or foods. These include toast or cereal.  6 hours before the procedure - stop drinking milk or drinks that contain milk.  2 hours before the procedure - stop drinking clear liquids. Medicines  Ask your  doctor about changing or stopping: ? Your normal medicines. ? Vitamins, herbs, and supplements. ? Over-the-counter medicines.  Do not take aspirin or ibuprofen unless you are told to. Tests and exams  You may have a physical exam.  You may have tests, such as: ? X-rays. ? MRI. ? CT scan. ? Bone scan.  You may have a blood or pee (urine) sample taken. Lifestyle  Keep your body and teeth clean. Germs from anywhere in your body can infect your new joint. Tell your doctor: ? If you plan to have dental care and routine cleanings. ? If you get any skin infections.  If your doctor tells you to do exercises to help your knee move better and get stronger, do them as told.  If you are overweight, work with your doctor to reach a safe weight. Extra weight can affect your knee.  Do not smoke or use any products that contain nicotine or tobacco for 4 weeks before the procedure. If you need help quitting, ask your doctor.   Surgery safety For your safety, your doctor may:  Loraine Leriche the area of surgery.  Remove hair at the surgery site.  Ask you to wash with a soap that kills germs.  Give you antibiotic medicine. General instructions  Do not shave your legs just before surgery. This will be done in the hospital if needed.  Plan to have a responsible adult take you home from the hospital or clinic.  Plan to have a responsible adult care for you for  the time you are told after you leave the hospital or clinic. This is important. ? It is best to have someone care for you for at least 4-6 weeks when you get home. What happens during the procedure?  An IV tube will be put into one of your veins.  You may be given: ? A sedative. This medicine helps you relax. ? Anesthetics. These medicines:  Numb certain areas of your body.  Make you fall asleep for surgery.  A cut (incision) will be made in your knee.  Bad parts of your knee will be taken out. The bad parts will be replaced by  new, man-made parts.  Your incision will be closed. A bandage will be placed over your incision. The procedure may vary among doctors and hospitals. What happens after the procedure?  You will be monitored until you leave the hospital or clinic. This includes checking your blood pressure, heart rate, breathing rate, and blood oxygen level.  You will be given medicines for pain.  You may keep getting fluids from your IV tube.  You will be told to move around as much as you can.  You may wear special socks called compression stockings.  Do not drive until your doctor tells you it is okay. Summary  Total knee replacement is a surgery to replace a bad knee joint with a man-made joint.  Before the procedure, follow what you are told about eating, drinking, and taking medicines.  Plan to have a responsible adult take you home from the hospital or clinic. This information is not intended to replace advice given to you by your health care provider. Make sure you discuss any questions you have with your health care provider. Document Revised: 12/27/2019 Document Reviewed: 12/27/2019 Elsevier Patient Education  2021 [employer].

## 2020-09-12 NOTE — Progress Notes (Signed)
Orthopaedic Clinic Return  Assessment: Luis Ford is a 60 y.o. male with the following: Right knee arthritis  Plan: Luis Ford has severe and progressive right knee arthritis.  He is at a point where he is interested in a knee replacement if he is a candidate.  I have discussed this with Dr. Romeo Apple, who was able to discuss this briefly with the patient today.  He will follow Dr. Mort Sawyers recommendations and follow up as needed.    Body mass index is 28.31 kg/m.  Follow-up: Return if symptoms worsen or fail to improve.   Subjective:  Chief Complaint  Patient presents with  . Knee Pain    Right knee pain, Patient reports he had a brace and it was helping some and pain got worse in last few days.     History of Present Illness: Luis Ford is a 60 y.o. male who returns to clinic today for repeat evaluation of his right knee.  I last saw him in clinic approximately 5 months ago.  At that time I recommended against arthroscopic surgery.  He had an injection prior to that visit that did not provide sufficient relief of his symptoms.  He has tried wearing a compression brace on his knee.  This helped for a couple of months, but is no longer helping.  He is taking tylenol and using topical medications, without improvement in his symptoms.  His pain gets worse at night and affects his sleep.   Review of Systems: No fevers or chills No numbness or tingling No chest pain No shortness of breath No bowel or bladder dysfunction No GI distress No headaches  Objective: BP (!) 151/106   Pulse 89   Ht 6\' 6"  (1.981 m)   BMI 28.31 kg/m   Physical Exam:  Alert and oriented.  No acute distress Uses crutches to assist with ambulation   Right knee varus alignment ROM 5-120 with pain at extremes Medial and lateral joint line tenderness Pain with ROM testing Negative Lachman No increased laxity to varus or valgus stress  IMAGING: I personally ordered and reviewed the  following images:  XR of the right knee were obtained in clinic today and compared to previous imaging dated April, 2021.  There has been progression of medial compartment arthritis.  Within the medial compartment there is advanced degenerative changes with complete loss of joint space.  There are osteophytes within the medial compartment, as well as the patellofemoral compartment.  Loss of joint space lateral.  Impression: severe right knee arthritis with bone on bone articulation within the medial compartment.    03-07-1999, MD 09/12/2020 9:14 AM

## 2020-09-13 ENCOUNTER — Telehealth: Payer: Self-pay | Admitting: Orthopedic Surgery

## 2020-09-13 NOTE — Telephone Encounter (Signed)
Thanks Carla  Dr. Romeo Apple discussed knee replacement with the patient yesterday, and Amy placed the order for his blood work. I have included her on this message so she can address his questions accordingly.   Thanks [employer]

## 2020-09-13 NOTE — Telephone Encounter (Signed)
Patient called and left voicemail stating he has not received a phone call about doing his blood work yet.    Please call him back

## 2020-09-13 NOTE — Telephone Encounter (Addendum)
When he was here I told him to contact his primary care doctor for the labs and exam with them for preop.  I called him to let him know. Again, he has to call primary care to set this up. Unfortunately his voice mail is not set up yet, and he didn't answer the phone.   He could also read his instructions Dr Romeo Apple put in the AVS [facility] See primary care for preop and HgA1c

## 2020-09-13 NOTE — Telephone Encounter (Signed)
He states he called over there and no one knew what he needed. I told him the person he spoke to may not know where to look but if he makes appointment with his primary care doctor, his doctor has gotten the information in computer and will know wheat he needs  he voiced understanding. He will let me know after he has had the labs and the visit with primary care  He also states he has history of complications from anesthesia almost died is surgery and he has to have a spinal, I told him to make sure he tells anesthesia when he meets with them, he voiced understanding  To you FYI

## 2020-09-14 ENCOUNTER — Encounter: Payer: Self-pay | Admitting: Family Medicine

## 2020-09-14 ENCOUNTER — Ambulatory Visit: Payer: BC Managed Care – PPO | Admitting: Family Medicine

## 2020-09-14 ENCOUNTER — Telehealth: Payer: Self-pay | Admitting: Orthopedic Surgery

## 2020-09-14 ENCOUNTER — Other Ambulatory Visit: Payer: Self-pay

## 2020-09-14 VITALS — BP 130/70 | HR 81 | Ht 78.0 in | Wt 244.0 lb

## 2020-09-14 DIAGNOSIS — E1169 Type 2 diabetes mellitus with other specified complication: Secondary | ICD-10-CM | POA: Diagnosis not present

## 2020-09-14 DIAGNOSIS — I10 Essential (primary) hypertension: Secondary | ICD-10-CM | POA: Diagnosis not present

## 2020-09-14 DIAGNOSIS — N529 Male erectile dysfunction, unspecified: Secondary | ICD-10-CM | POA: Diagnosis not present

## 2020-09-14 DIAGNOSIS — Z125 Encounter for screening for malignant neoplasm of prostate: Secondary | ICD-10-CM

## 2020-09-14 DIAGNOSIS — E782 Mixed hyperlipidemia: Secondary | ICD-10-CM

## 2020-09-14 LAB — BAYER DCA HB A1C WAIVED: HB A1C (BAYER DCA - WAIVED): 6.6 % (ref <7.0)

## 2020-09-14 MED ORDER — LISINOPRIL-HYDROCHLOROTHIAZIDE 20-12.5 MG PO TABS: 2.0000 | ORAL_TABLET | Freq: Every day | ORAL | 3 refills | Status: DC

## 2020-09-14 NOTE — Telephone Encounter (Signed)
I have reviewed chart and it does look like he saw his primary care  I spoke to [place], and this message was taken off voice mail, and must be old. He must have understood me, since he did see PCP and did have labs.   Disregard.

## 2020-09-14 NOTE — Telephone Encounter (Signed)
Patient called this morning left voiicemail stating he went to [place] this morning in [place].  No bloodwork papers were sent no fax papers. You and them will have to get together and find out what his bloodwork is, but I did have it done this morning.

## 2020-09-14 NOTE — Telephone Encounter (Signed)
I have already discussed with him, I told him not to just go over and ask for labs told him to make appointment with his primary care. For some reason he can't understand what I am telling him to do. I called again to advise. Voice mail box not set up   He was given instructions on AVS but apparently did not read that either.   I am a little concerned he is so confused. I have advised him x4 now to make appointment with his primary care AND it was in his AVS  To Dr Mauri Brooklyn I am concerned he is not understanding me about seeing his primary care. He may be very confused.

## 2020-09-14 NOTE — Progress Notes (Signed)
BP 130/70   Pulse 81   Ht _0  (1.981 m)   Wt 244 lb (110.7 kg)   SpO2 100%   BMI 28.20 kg/m    Subjective:   Patient ID: Luis Ford, male    DOB: [birth year], 60 y.o.   MRN: [id number]  HPI: Luis Ford is a 61 y.o. male presenting on 09/14/2020 for Medical Management of Chronic Issues, Hyperlipidemia, and Prediabetes   HPI Preoperative examination Patient is coming in for preoperative examination for total knee arthroplasty.  Type 2 diabetes mellitus Patient comes in today for recheck of his diabetes. Patient has been currently taking Metformin. Patient is currently on an ACE inhibitor/ARB. Patient has not seen an ophthalmologist this year. Patient denies any issues with their feet. The symptom started onset as an adult hypertension hyperlipidemia ARE RELATED TO DM   Hypertension Patient is currently on lisinopril hydrochlorothiazide, and their blood pressure today is 130/70. Patient denies any lightheadedness or dizziness. Patient denies headaches, blurred vision, chest pains, shortness of breath, or weakness. Denies any side effects from medication and is content with current medication.   Hyperlipidemia Patient is coming in for recheck of his hyperlipidemia. The patient is currently taking Crestor. They deny any issues with myalgias or history of liver damage from it. They deny any focal numbness or weakness or chest pain.   Patient has erectile dysfunction and has used sildenafil occasionally for this and it works well.  Relevant past medical, surgical, family and social history reviewed and updated as indicated. Interim medical history since our last visit reviewed. Allergies and medications reviewed and updated.  Review of Systems  Constitutional: Negative for chills and fever.  Respiratory: Negative for shortness of breath and wheezing.   Cardiovascular: Negative for chest pain and leg swelling.  Musculoskeletal: Negative for back pain and gait problem.   Skin: Negative for rash.  Neurological: Negative for dizziness, weakness and numbness.  All other systems reviewed and are negative.   Per HPI unless specifically indicated above   Allergies as of 09/14/2020      Reactions   Penicillins Other (See Comments)   Childhood illness       Medication List       Accurate as of September 14, 2020  8:35 AM. If you have any questions, ask your nurse or doctor.        diclofenac 75 MG EC tablet Commonly known as: VOLTAREN Take 1 tablet (75 mg total) by mouth 2 (two) times daily.   diclofenac Sodium 1 % Gel Commonly known as: VOLTAREN SMARTSIG:1 Inch(es) Topical 3 Times Daily   escitalopram 10 MG tablet Commonly known as: LEXAPRO Take 1 tablet (10 mg total) by mouth daily.   HYDROcodone-acetaminophen 5-325 MG tablet Commonly known as: NORCO/VICODIN One tablet every four hours as needed for acute pain.  Limit of five days per [place] statue.   lisinopril-hydrochlorothiazide 20-12.5 MG tablet Commonly known as: ZESTORETIC Take 2 tablets by mouth daily. What changed: additional instructions Changed by: Fransisca Kaufmann Shenay Torti, MD   metFORMIN 500 MG 24 hr tablet Commonly known as: GLUCOPHAGE-XR Take 1 tablet by mouth daily.   omeprazole 20 MG capsule Commonly known as: PRILOSEC Take 1 capsule (20 mg total) by mouth daily.   rosuvastatin 40 MG tablet Commonly known as: Crestor Take 1 tablet (40 mg total) by mouth daily.   sildenafil 20 MG tablet Commonly known as: REVATIO Take 1-3 tablets (20-60 mg total) by mouth as needed.   Vitamin  D3 125 MCG (5000 UT) Caps TAKE ONE CAPSULE BY MOUTH DAILY.        Objective:   BP 130/70   Pulse 81   Ht $R'6\' 6"'OD$  (1.981 m)   Wt 244 lb (110.7 kg)   SpO2 100%   BMI 28.20 kg/m   Wt Readings from Last 3 Encounters:  09/14/20 244 lb (110.7 kg)  03/20/20 245 lb (111.1 kg)  11/15/19 245 lb 9.6 oz (111.4 kg)    Physical Exam Vitals and nursing note reviewed.  Constitutional:       General: He is not in acute distress.    Appearance: He is well-developed and well-nourished. He is not diaphoretic.  Eyes:     General: No scleral icterus.    Extraocular Movements: EOM normal.     Conjunctiva/sclera: Conjunctivae normal.  Neck:     Thyroid: No thyromegaly.  Cardiovascular:     Rate and Rhythm: Normal rate and regular rhythm.     Pulses: Intact distal pulses.     Heart sounds: Normal heart sounds. No murmur heard.   Pulmonary:     Effort: Pulmonary effort is normal. No respiratory distress.     Breath sounds: Normal breath sounds. No wheezing.  Musculoskeletal:        General: No edema. Normal range of motion.     Cervical back: Neck supple.  Lymphadenopathy:     Cervical: No cervical adenopathy.  Skin:    General: Skin is warm and dry.     Findings: No rash.  Neurological:     Mental Status: He is alert and oriented to person, place, and time.     Coordination: Coordination normal.  Psychiatric:        Mood and Affect: Mood and affect normal.        Behavior: Behavior normal.    Diabetic Foot Exam - Simple   Simple Foot Form Diabetic Foot exam was performed with the following findings: Yes 09/14/2020  8:18 AM  Visual Inspection See comments: Yes Sensation Testing See comments: Yes Pulse Check Posterior Tibialis and Dorsalis pulse intact bilaterally: Yes Comments Patient has significantly thickened nails and twisted, also less than the distal part of his right little toe and left fourth  Almost no sensation to fine touch in the feet at all      EKG: Normal sinus rhythm Assessment & Plan:   Problem List Items Addressed This Visit      Cardiovascular and Mediastinum   Essential hypertension   Relevant Medications   lisinopril-hydrochlorothiazide (ZESTORETIC) 20-12.5 MG tablet   Other Relevant Orders   EKG 12-Lead (Completed)     Endocrine   Type 2 diabetes mellitus with other specified complication (HCC) - Primary   Relevant Medications    lisinopril-hydrochlorothiazide (ZESTORETIC) 20-12.5 MG tablet   Other Relevant Orders   EKG 12-Lead (Completed)     Other   Hyperlipidemia   Relevant Medications   lisinopril-hydrochlorothiazide (ZESTORETIC) 20-12.5 MG tablet   Other Relevant Orders   CBC with Differential/Platelet   CMP14+EGFR   Lipid panel   PSA, total and free   Bayer DCA Hb A1c Waived   EKG 12-Lead (Completed)    Other Visit Diagnoses    Erectile dysfunction, unspecified erectile dysfunction type       Relevant Orders   PSA, total and free   Prostate cancer screening          patient already sees podiatry will continue to see them for his feet Follow up plan:  Return in about 3 months (around 12/12/2020), or if symptoms worsen or fail to improve, for Diabetes recheck.  Counseling provided for all of the vaccine components Orders Placed This Encounter  Procedures  . CBC with Differential/Platelet  . CMP14+EGFR  . Lipid panel  . PSA, total and free  . Bayer DCA Hb A1c Waived  . EKG 12-Lead    Caryl Pina, MD [facility] 09/14/2020, 8:35 AM

## 2020-09-15 LAB — CBC WITH DIFFERENTIAL/PLATELET
Basophils Absolute: 0.1 10*3/uL (ref 0.0–0.2)
Basos: 1 %
EOS (ABSOLUTE): 0.1 10*3/uL (ref 0.0–0.4)
Eos: 1 %
Hematocrit: 45.5 % (ref 37.5–51.0)
Hemoglobin: 14.8 g/dL (ref 13.0–17.7)
Immature Grans (Abs): 0.1 10*3/uL (ref 0.0–0.1)
Immature Granulocytes: 1 %
Lymphocytes Absolute: 0.9 10*3/uL (ref 0.7–3.1)
Lymphs: 12 %
MCH: 28.5 pg (ref 26.6–33.0)
MCHC: 32.5 g/dL (ref 31.5–35.7)
MCV: 88 fL (ref 79–97)
Monocytes Absolute: 0.5 10*3/uL (ref 0.1–0.9)
Monocytes: 7 %
Neutrophils Absolute: 5.5 10*3/uL (ref 1.4–7.0)
Neutrophils: 78 %
Platelets: 284 10*3/uL (ref 150–450)
RBC: 5.2 x10E6/uL (ref 4.14–5.80)
RDW: 12.1 % (ref 11.6–15.4)
WBC: 7.1 10*3/uL (ref 3.4–10.8)

## 2020-09-15 LAB — CMP14+EGFR
ALT: 26 IU/L (ref 0–44)
AST: 21 IU/L (ref 0–40)
Albumin/Globulin Ratio: 2.1 (ref 1.2–2.2)
Albumin: 4.8 g/dL (ref 3.8–4.9)
Alkaline Phosphatase: 80 IU/L (ref 44–121)
BUN/Creatinine Ratio: 12 (ref 9–20)
BUN: 18 mg/dL (ref 6–24)
Bilirubin Total: 0.4 mg/dL (ref 0.0–1.2)
CO2: 24 mmol/L (ref 20–29)
Calcium: 10.1 mg/dL (ref 8.7–10.2)
Chloride: 98 mmol/L (ref 96–106)
Creatinine, Ser: 1.47 mg/dL — ABNORMAL HIGH (ref 0.76–1.27)
GFR calc Af Amer: 60 mL/min/{1.73_m2} (ref >59)
GFR calc non Af Amer: 51 mL/min/{1.73_m2} — ABNORMAL LOW (ref >59)
Globulin, Total: 2.3 g/dL (ref 1.5–4.5)
Glucose: 117 mg/dL — ABNORMAL HIGH (ref 65–99)
Potassium: 5.1 mmol/L (ref 3.5–5.2)
Sodium: 139 mmol/L (ref 134–144)
Total Protein: 7.1 g/dL (ref 6.0–8.5)

## 2020-09-15 LAB — PSA, TOTAL AND FREE
PSA, Free Pct: 37.5 %
PSA, Free: 0.15 ng/mL
Prostate Specific Ag, Serum: 0.4 ng/mL (ref 0.0–4.0)

## 2020-09-15 LAB — LIPID PANEL
Chol/HDL Ratio: 5.1 ratio — ABNORMAL HIGH (ref 0.0–5.0)
Cholesterol, Total: 169 mg/dL (ref 100–199)
HDL: 33 mg/dL — ABNORMAL LOW (ref >39)
LDL Chol Calc (NIH): 105 mg/dL — ABNORMAL HIGH (ref 0–99)
Triglycerides: 173 mg/dL — ABNORMAL HIGH (ref 0–149)
VLDL Cholesterol Cal: 31 mg/dL (ref 5–40)

## 2020-09-17 ENCOUNTER — Encounter: Payer: Self-pay | Admitting: Orthopedic Surgery

## 2020-09-17 ENCOUNTER — Telehealth: Payer: Self-pay

## 2020-09-17 ENCOUNTER — Telehealth: Payer: Self-pay | Admitting: Orthopedic Surgery

## 2020-09-17 NOTE — Telephone Encounter (Signed)
I spoke to [place] today at  His PCP office she could not find letter, I explained to her she can print it out or they can just put an addendum in epic or the doctor can write phone message note, it is fine as long as it is somewhere in the chart he is cleared for surgery.

## 2020-09-17 NOTE — Telephone Encounter (Signed)
Pt called stating that Dr Mort Sawyers office is waiting on [name] to send surgery clearance form to them before pt can have surgery.

## 2020-09-17 NOTE — Telephone Encounter (Signed)
Have printed work note.  Out of work 12 weeks if he wants to go back sooner, he can let us know and we can provide new note.  He wants [name] to fax he will call back with fax number   Told him as soon as we get clearance can discuss surgical scheduling

## 2020-09-17 NOTE — Telephone Encounter (Signed)
Left message to return call. Pt had clearance on 2/25. Not sure that we had form that day. Not at nurses station or Det[facility]er's desk. Our fax number given so pt can call surgeon's office fax surgical clearance form.

## 2020-09-17 NOTE — Telephone Encounter (Signed)
I have sent the letter again to PCP for clearance.   I called patient to advise he states PCP told him he is not high risk for surgery, I told him patient has to have the doctor put in the note or send [name] a letter back, it is not in his note from the visit  Patient voiced understanding.    He needs letter to be out of work ok to provide letter for out of work ?

## 2020-09-17 NOTE — Telephone Encounter (Signed)
Patient called wanting to know if we have received a copy of the blood results.  Spoke with Amy she is in clinic now, can't take the call..   Please call patient back at on his cell phone he does have voicemail set up now.

## 2020-09-17 NOTE — Telephone Encounter (Signed)
Patient called again stating that his work is saying he needs a note for work stating he will be out with surgery.  If not they are going to fire him.

## 2020-09-17 NOTE — Telephone Encounter (Signed)
[facility] See primary care for preop and HgA1c   He did see his primary care but I have not gotten any indication from them if he is cleared for surgery  His A1C is 6.6 so he is ok  There, but must have the clearance from PCP on file, I  Will call him

## 2020-09-17 NOTE — Telephone Encounter (Signed)
YES

## 2020-09-17 NOTE — Telephone Encounter (Signed)
Per patient's request and verbal approval, work note faxed to employer, [employer], attention plant nurse, Olegario Messier at fax#[phone], sent at 3:41pm; patient aware.

## 2020-09-18 NOTE — Telephone Encounter (Signed)
Our note is in letters tab in Epic, I spoke to Luis Ford yesterday told her how to view it  No response yet, so forwarding letter in case she cant view it, have sent twice to Dr Louanne Skye.   September 17, 2020  Dear Dr. Louanne Skye,  Per evaluation by Dr. Romeo Apple on 09/12/20, the above named patient needs to be scheduled for knee replacement in the near future, under general anesthesia.  Please call the patient to schedule an office visit for further medical work-up and clearance.  We would appreciate your evaluation and opinion on risk assessment. We can NOT perform the surgery until this assessment is received by [name]. Thank you for your help and allowing [name] to care for your patient.   Please check all that apply                                                                                                          []  Optimized for surgery from a medical and cardiac standpoint []  Optimized for surgery from a medical standpoint only []  Optimized for surgery from a cardiac standpoint only []  HgB A1C is _______________ (Less than 7.5 is required for surgery) []  BMI is ___________(Less than 40 is required for surgery) []  Anticoagulation / Medication: ______________________ is ok to stop ________ days prior to surgery     Sincerely,     Dr 

## 2020-09-19 NOTE — Telephone Encounter (Signed)
I just completed it, we can fax it to their office now.

## 2020-09-19 NOTE — Telephone Encounter (Signed)
Clearance form faxed to [facility]. [phone]

## 2020-09-19 NOTE — Telephone Encounter (Signed)
Dr. Louanne Skye,  Is this letter on your desk? I printed it yesterday and put in your office.

## 2020-09-20 ENCOUNTER — Telehealth: Payer: Self-pay | Admitting: Radiology

## 2020-09-20 NOTE — Telephone Encounter (Signed)
Call to schedule surgery have received clearance  TKR

## 2020-09-20 NOTE — Telephone Encounter (Signed)
I need the clearance letter when It gets here They didn't put anything in epic  Can you forward to me when it arrives?

## 2020-09-21 NOTE — Telephone Encounter (Signed)
Left message for him to call back to schedule surgery   He is ready to schedule as soon as possible,  Can I put him on Friday March 18th? Nothing there yet, Tuesday you are in a meeting so no surgery, just dont know how you feel about a total on a Friday

## 2020-09-21 NOTE — Telephone Encounter (Signed)
No but you can put 2 on one day on a tues

## 2020-09-24 ENCOUNTER — Telehealth: Payer: Self-pay | Admitting: Radiology

## 2020-09-24 NOTE — Telephone Encounter (Signed)
-----   Message from Keane Scrape sent at 09/24/2020  1:55 PM EST ----- Mr. Luis Ford called at lunch and left message asking about scheduling his surgery.  Would you call him and let him know if this has been done?  Thanks

## 2020-09-24 NOTE — Telephone Encounter (Signed)
Left another message for him to call me back to schedule surgery.

## 2020-09-24 NOTE — Telephone Encounter (Signed)
Have been waiting on him to call me back I will call him to schedule I did receive clearance

## 2020-09-25 ENCOUNTER — Other Ambulatory Visit: Payer: Self-pay | Admitting: Orthopedic Surgery

## 2020-09-25 NOTE — Telephone Encounter (Signed)
Called he answered scheduled for 10/09/20 He understands he will get a call to schedule

## 2020-09-26 ENCOUNTER — Other Ambulatory Visit: Payer: Self-pay | Admitting: Orthopedic Surgery

## 2020-09-26 ENCOUNTER — Telehealth: Payer: Self-pay | Admitting: Radiology

## 2020-09-26 DIAGNOSIS — M1711 Unilateral primary osteoarthritis, right knee: Secondary | ICD-10-CM

## 2020-09-26 NOTE — Telephone Encounter (Signed)
I called [employer] no auth needed for his total knee surgery. Reference number for the call is [id number]

## 2020-09-27 ENCOUNTER — Telehealth: Payer: Self-pay | Admitting: Orthopedic Surgery

## 2020-09-27 NOTE — Telephone Encounter (Signed)
Received voice message from patient inquiring about forms for short-term disability. I returned call; left message, relayed [employer] process, as previously discussed, and that patient will need to bring the forms he receives through employer and/or short-term disability insurer. Asked patient to return call if questions.

## 2020-10-01 ENCOUNTER — Encounter: Payer: Self-pay | Admitting: Orthopedic Surgery

## 2020-10-03 NOTE — Patient Instructions (Signed)
Luis Ford  10/03/2020     @[email or web address] @   Your procedure is scheduled on Tuesday, 10/09/20.  Report to 10/11/20 at [phone] A.M.  Call this number if you have problems the morning of surgery:  [phone]   Remember:  Do not eat or drink after midnight.     Take these medicines the morning of surgery with A SIP OF WATER omeprazole and norco if needed    Do not wear jewelry, make-up or nail polish.  Do not wear lotions, powders, or perfumes, or deodorant.  Do not shave 48 hours prior to surgery.  Men may shave face and neck.  Do not bring valuables to the hospital.  [facility] is not responsible for any belongings or valuables.  Contacts, dentures or bridgework may not be worn into surgery.  Leave your suitcase in the car.  After surgery it may be brought to your room.  For patients admitted to the hospital, discharge time will be determined by your treatment team.  Patients discharged the day of surgery will not be allowed to drive home.   Name and phone number of your driver:   family   Please read over the following fact sheets that you were given. Pain Booklet, Coughing and Deep Breathing and Anesthesia Post-op Instructions    How to Use Chlorhexidine for Bathing Chlorhexidine gluconate (CHG) is a germ-killing (antiseptic) solution that is used to clean the skin. It can get rid of the bacteria that normally live on the skin and can keep them away for about 24 hours. To clean your skin with CHG, you may be given:  A CHG solution to use in the shower or as part of a sponge bath.  A prepackaged cloth that contains CHG. Cleaning your skin with CHG may help lower the risk for infection:  While you are staying in the intensive care unit of the hospital.  If you have a vascular access, such as a central line, to provide short-term or long-term access to your veins.  If you have a catheter to drain urine from your bladder.  If you are on a ventilator. A  ventilator is a machine that helps you breathe by moving air in and out of your lungs.  After surgery. What are the risks? Risks of using CHG include:  A skin reaction.  Hearing loss, if CHG gets in your ears.  Eye injury, if CHG gets in your eyes and is not rinsed out.  The CHG product catching fire. Make sure that you avoid smoking and flames after applying CHG to your skin. Do not use CHG:  If you have a chlorhexidine allergy or have previously reacted to chlorhexidine.  On babies younger than 62 months of age. How to use CHG solution  Use CHG only as told by your health care provider, and follow the instructions on the label.  Use the full amount of CHG as directed. Usually, this is one bottle. During a shower Follow these steps when using CHG solution during a shower (unless your health care provider gives you different instructions): 1. Start the shower. 2. Use your normal soap and shampoo to wash your face and hair. 3. Turn off the shower or move out of the shower stream. 4. Pour the CHG onto a clean washcloth. Do not use any type of brush or rough-edged sponge. 5. Starting at your neck, lather your body down to your toes. Make sure you follow these instructions: ? If you will  be having surgery, pay special attention to the part of your body where you will be having surgery. Scrub this area for at least 1 minute. ? Do not use CHG on your head or face. If the solution gets into your ears or eyes, rinse them well with water. ? Avoid your genital area. ? Avoid any areas of skin that have broken skin, cuts, or scrapes. ? Scrub your back and under your arms. Make sure to wash skin folds. 6. Let the lather sit on your skin for 1-2 minutes or as long as told by your health care provider. 7. Thoroughly rinse your entire body in the shower. Make sure that all body creases and crevices are rinsed well. 8. Dry off with a clean towel. Do not put any substances on your body  afterward--such as powder, lotion, or perfume--unless you are told to do so by your health care provider. Only use lotions that are recommended by the manufacturer. 9. Put on clean clothes or pajamas. 10. If it is the night before your surgery, sleep in clean sheets.   During a sponge bath Follow these steps when using CHG solution during a sponge bath (unless your health care provider gives you different instructions): 1. Use your normal soap and shampoo to wash your face and hair. 2. Pour the CHG onto a clean washcloth. 3. Starting at your neck, lather your body down to your toes. Make sure you follow these instructions: ? If you will be having surgery, pay special attention to the part of your body where you will be having surgery. Scrub this area for at least 1 minute. ? Do not use CHG on your head or face. If the solution gets into your ears or eyes, rinse them well with water. ? Avoid your genital area. ? Avoid any areas of skin that have broken skin, cuts, or scrapes. ? Scrub your back and under your arms. Make sure to wash skin folds. 4. Let the lather sit on your skin for 1-2 minutes or as long as told by your health care provider. 5. Using a different clean, wet washcloth, thoroughly rinse your entire body. Make sure that all body creases and crevices are rinsed well. 6. Dry off with a clean towel. Do not put any substances on your body afterward--such as powder, lotion, or perfume--unless you are told to do so by your health care provider. Only use lotions that are recommended by the manufacturer. 7. Put on clean clothes or pajamas. 8. If it is the night before your surgery, sleep in clean sheets. How to use CHG prepackaged cloths  Only use CHG cloths as told by your health care provider, and follow the instructions on the label.  Use the CHG cloth on clean, dry skin.  Do not use the CHG cloth on your head or face unless your health care provider tells you to.  When washing with  the CHG cloth: ? Avoid your genital area. ? Avoid any areas of skin that have broken skin, cuts, or scrapes. Before surgery Follow these steps when using a CHG cloth to clean before surgery (unless your health care provider gives you different instructions): 1. Using the CHG cloth, vigorously scrub the part of your body where you will be having surgery. Scrub using a back-and-forth motion for 3 minutes. The area on your body should be completely wet with CHG when you are done scrubbing. 2. Do not rinse. Discard the cloth and let the area air-dry. Do not  put any substances on the area afterward, such as powder, lotion, or perfume. 3. Put on clean clothes or pajamas. 4. If it is the night before your surgery, sleep in clean sheets.   For general bathing Follow these steps when using CHG cloths for general bathing (unless your health care provider gives you different instructions). 1. Use a separate CHG cloth for each area of your body. Make sure you wash between any folds of skin and between your fingers and toes. Wash your body in the following order, switching to a new cloth after each step: ? The front of your neck, shoulders, and chest. ? Both of your arms, under your arms, and your hands. ? Your stomach and groin area, avoiding the genitals. ? Your right leg and foot. ? Your left leg and foot. ? The back of your neck, your back, and your buttocks. 2. Do not rinse. Discard the cloth and let the area air-dry. Do not put any substances on your body afterward--such as powder, lotion, or perfume--unless you are told to do so by your health care provider. Only use lotions that are recommended by the manufacturer. 3. Put on clean clothes or pajamas. Contact a health care provider if:  Your skin gets irritated after scrubbing.  You have questions about using your solution or cloth. Get help right away if:  Your eyes become very red or swollen.  Your eyes itch badly.  Your skin itches badly  and is red or swollen.  Your hearing changes.  You have trouble seeing.  You have swelling or tingling in your mouth or throat.  You have trouble breathing.  You swallow any chlorhexidine. Summary  Chlorhexidine gluconate (CHG) is a germ-killing (antiseptic) solution that is used to clean the skin. Cleaning your skin with CHG may help to lower your risk for infection.  You may be given CHG to use for bathing. It may be in a bottle or in a prepackaged cloth to use on your skin. Carefully follow your health care provider's instructions and the instructions on the product label.  Do not use CHG if you have a chlorhexidine allergy.  Contact your health care provider if your skin gets irritated after scrubbing. This information is not intended to replace advice given to you by your health care provider. Make sure you discuss any questions you have with your health care provider. Document Revised: 12/23/2019 Document Reviewed: 12/23/2019 Elsevier Patient Education  2021 [employer]    Total Knee Replacement, Care After After the procedure, it is common to have stiffness and discomfort, or redness, pain, and swelling around your cut from surgery (incision). You may also have a small amount of blood or clear fluid coming from your incision. Follow these instructions at home: Your doctor may give you more instructions. If you have problems, contact your doctor. Medicines  Take over-the-counter and prescription medicines only as told by your doctor.  If you were prescribed a blood thinner, take it as told by your doctor.  If told, take steps to prevent problems with pooping (constipation). You may need to: ? Drink enough fluid to keep your pee (urine) pale yellow. ? Take medicines. You will be told what medicines to take. ? Eat foods that are high in fiber. These include beans, whole grains, and fresh fruits and vegetables. ? Limit foods that are high in fat and sugar. These include  fried or sweet foods. Incision care  Follow instructions from your doctor about how to take care  of your incision. Make sure you: ? Wash your hands with soap and water for at least 20 seconds before and after you change your bandage. If you cannot use soap and water, use hand sanitizer. ? Change your bandage. ? Leave stitches, staples, or skin glue in place for at least 2 weeks. ? Leave tape strips alone unless you are told to take them off. You may trim the edges of the tape strips if they curl up.  Do not take baths, swim, or use a hot tub until your doctor says it is okay.  Check your incision every day for signs of infection. Check for: ? More redness, swelling, or pain. ? More fluid or blood. ? Warmth. ? Pus or a bad smell.   Activity  Rest as told by your doctor.  Get up to take short walks every 1 to 2 hours. Ask for help if you feel weak or unsteady.  Follow instructions from your doctor about: ? Using a walker, crutches, or a cane. ? How much body weight you may safely support on your affected leg (weight-bearing restrictions). ? How to get out of a bed and chair and how to go up and down stairs. A physical therapist will show you how to do this.  Do exercises as told by your doctor or physical therapist.  Avoid activities that put stress on your knees. These include running, jumping rope, and doing jumping jacks.  Do not play contact sports until your doctor says it is okay.  Return to your normal activities when your doctor says that it is safe. Managing pain, stiffness, and swelling  If told, put ice on your knee. To do this: ? Put ice in a plastic bag or use an icing device (cold flow pad). Follow your doctor's directions about how to use the icing device. ? Place a towel between your skin and the bag or between your skin and the icing device. ? Leave the ice on for 20 minutes, 2-3 times a day. ? Take off the ice if your skin turns bright red. This is very  important. If you cannot feel pain, heat, or cold, you have a greater risk of damage to the area.  Move your toes often.  Raise your leg above the level of your heart while you are sitting or lying down. ? Use a few pillows to keep your leg straight. ? Do not put a pillow just under the knee. If the knee is bent for a long time, this may make the knee stiff.  Wear elastic knee support as told by your doctor.   Safety  To help prevent falls: ? Keep floors clear of objects you may trip over. ? Place items that you may need within easy reach.  Wear an apron or tool belt with pockets for carrying objects. This leaves your hands free to help with your balance.  Do not drive or use machines until your doctor says it is safe.   General instructions  Wear compression stockings as told by your doctor.  Continue with breathing exercises. This helps prevent lung infection.  Do not smoke or use any products that contain nicotine or tobacco. If you need help quitting, ask your doctor.  If you plan to visit a dentist: ? Tell your doctor. Ask about things to do before your teeth are cleaned. ? Tell your dentist about your new joint.  Keep all follow-up visits. Contact a doctor if:  You have a fever or  chills.  You have a cough or feel short of breath.  Your medicine is not controlling your pain.  You have any of these signs of infection around your incision: ? More redness, swelling, or pain. ? More fluid or blood. ? Warmth. ? Pus or a bad smell.  You fall. Get help right away if:  You have very bad pain.  You have trouble breathing.  You have chest pain.  You have pain in your calf or leg.  You have redness, swelling, or warmth in your calf or leg.  Your incision breaks open after the stitches or staples are taken out. These symptoms may be an emergency. Get help right away. Call your local emergency services (911 in the [place]  Do not wait to see if the symptoms will  go away.  Do not drive yourself to the hospital. Summary  After the procedure, it is common to have stiffness and discomfort, or redness, pain, and swelling around your incision.  Follow instructions from your doctor about how to take care of your incision.  Use crutches, a walker, or a cane as told by your doctor.  If you were prescribed a blood thinner, take it as told by your doctor.  Keep all follow-up visits. This information is not intended to replace advice given to you by your health care provider. Make sure you discuss any questions you have with your health care provider. Document Revised: 12/27/2019 Document Reviewed: 12/27/2019 Elsevier Patient Education  2021 [employer] Total Knee Replacement  Total knee replacement is a surgery to replace a bad knee joint with a man-made joint. The man-made joint is called a prosthesis. This joint may be made of plastic, metal, or ceramic parts. It replaces parts of the thigh bone, lower leg bone, and kneecap. Tell your doctor about:  Any allergies you have.  All medicines you are taking. This includes vitamins, herbs, eye drops, creams, and over-the-counter medicines.  Any problems you or family members have had with medicines that make you fall asleep (anesthetics).  Any blood disorders you have.  Any surgeries you have had.  Any medical conditions you have.  Whether you are pregnant or may be pregnant. What are the risks? Generally, this is a safe procedure. But, problems may occur, such as:  Infection.  Bleeding.  Blood clot.  Allergies from medicines.  Damage to nerves and other parts of the knee.  Problems with the knee, such as: ? Not being able to move your knee. ? A feeling that the knee is weak or unstable. ? Loosening of the new joint. ? Pain that does not go away. What happens before the procedure? Staying hydrated Follow instructions from your doctor about hydration. These may include:  Up to 2  hours before the procedure - you may continue to drink clear liquids. These include water, clear fruit juice, black coffee, and plain tea.   Eating and drinking restrictions Follow instructions from your doctor about eating and drinking. These may include:  8 hours before the procedure - stop eating heavy meals or foods. These include meat, fried foods, or fatty foods.  6 hours before the procedure - stop eating light meals or foods. These include toast or cereal.  6 hours before the procedure - stop drinking milk or drinks that contain milk.  2 hours before the procedure - stop drinking clear liquids. Medicines  Ask your doctor about changing or stopping: ? Your normal medicines. ? Vitamins, herbs, and supplements. ? Over-the-counter  medicines.  Do not take aspirin or ibuprofen unless you are told to. Tests and exams  You may have a physical exam.  You may have tests, such as: ? X-rays. ? MRI. ? CT scan. ? Bone scan.  You may have a blood or pee (urine) sample taken. Lifestyle  Keep your body and teeth clean. Germs from anywhere in your body can infect your new joint. Tell your doctor: ? If you plan to have dental care and routine cleanings. ? If you get any skin infections.  If your doctor tells you to do exercises to help your knee move better and get stronger, do them as told.  If you are overweight, work with your doctor to reach a safe weight. Extra weight can affect your knee.  Do not smoke or use any products that contain nicotine or tobacco for 4 weeks before the procedure. If you need help quitting, ask your doctor.   Surgery safety For your safety, your doctor may:  Loraine Leriche the area of surgery.  Remove hair at the surgery site.  Ask you to wash with a soap that kills germs.  Give you antibiotic medicine. General instructions  Do not shave your legs just before surgery. This will be done in the hospital if needed.  Plan to have a responsible adult take  you home from the hospital or clinic.  Plan to have a responsible adult care for you for the time you are told after you leave the hospital or clinic. This is important. ? It is best to have someone care for you for at least 4-6 weeks when you get home. What happens during the procedure?  An IV tube will be put into one of your veins.  You may be given: ? A sedative. This medicine helps you relax. ? Anesthetics. These medicines:  Numb certain areas of your body.  Make you fall asleep for surgery.  A cut (incision) will be made in your knee.  Bad parts of your knee will be taken out. The bad parts will be replaced by new, man-made parts.  Your incision will be closed. A bandage will be placed over your incision. The procedure may vary among doctors and hospitals. What happens after the procedure?  You will be monitored until you leave the hospital or clinic. This includes checking your blood pressure, heart rate, breathing rate, and blood oxygen level.  You will be given medicines for pain.  You may keep getting fluids from your IV tube.  You will be told to move around as much as you can.  You may wear special socks called compression stockings.  Do not drive until your doctor tells you it is okay. Summary  Total knee replacement is a surgery to replace a bad knee joint with a man-made joint.  Before the procedure, follow what you are told about eating, drinking, and taking medicines.  Plan to have a responsible adult take you home from the hospital or clinic. This information is not intended to replace advice given to you by your health care provider. Make sure you discuss any questions you have with your health care provider. Document Revised: 12/27/2019 Document Reviewed: 12/27/2019 Elsevier Patient Education  2021 [employer]. Preparing for Knee Replacement Getting prepared before knee replacement surgery can make recovery easier and more comfortable. This document  provides some tips and guidelines that will help you prepare for your surgery. Talk with your health care provider so you can learn what to expect before,  during, and after surgery. Ask questions if you do not understand something. Tell a health care provider about:  Any allergies you have.  All medicines you are taking, including vitamins, herbs, eye drops, creams, and over-the-counter medicines.  Any problems you or family members have had with anesthetic medicines.  Any blood disorders you have.  Any surgeries you have had.  Any medical conditions you have.  Whether you are pregnant or may be pregnant. What happens before the procedure? Visit your health care providers Keep all appointments before surgery. You will need to have a physical exam before surgery (preoperative exam) to make sure it is safe for you to have knee replacement surgery. You may also need to have more tests. When you go to the exam, bring a list of all the medicines and supplements, including herbs and vitamins, that you take. Have dental care and routine cleanings done before surgery. Germs from anywhere in your body, including your mouth, can travel to your new joint and infect it. Tell your dentist that you plan to have knee replacement surgery. Know the costs of surgery To find out how much the surgery will cost, call your insurance company as soon as you decide to have surgery. Ask questions like:  How much of the surgery and hospital stay will be covered?  What will be covered for: ? Medical equipment? ? Rehabilitation facilities? ? Home care? Prepare your home  Pick a recovery spot that is not your bed. It is better that you sit more upright during recovery. You may want to use a recliner.  Place items that you often use on a small table near your recovery spot. These may include the TV remote, a cordless phone or your mobile phone, a book, a laptop computer, and a water glass.  Move other items you  will need to shelves and drawers that are at countertop height. Do this in your kitchen, bathroom, and bedroom.  You may be given a walker to use at home. Check if you will have enough room to use a walker. Move around your home with your hands out about 6 inches (15 cm) from your sides. You will have enough room if you do not hit anything with your hands as you do this. Walk from: ? Your recovery spot to your kitchen and bathroom. ? Your bed to the bathroom.  Prepare some meals to freeze and reheat later. Make your home safe for recovery  Remove all clutter and throw rugs from your floors. This will help you avoid tripping.  Consider getting safety equipment that will be helpful during your recovery, such as: ? Grab bars in the shower and near the toilet. ? A raised toilet seat. This will help you get on and off the toilet more easily. ? A tub or shower bench.      Prepare your body  If you smoke, quit as soon as you can before surgery. If there is time, it is best to quit several months before surgery. Tell your surgeon if you use any products that contain nicotine or tobacco. These products include cigarettes, chewing tobacco, and vaping devices, such as e-cigarettes. These can delay healing. If you need help quitting, ask your health care provider.  Talk to your health care provider about doing exercises before your surgery. ? Doing these exercises in the weeks before your surgery may help reduce pain and improve function after surgery. ? Be sure to follow the exercise program given by your health  care provider.  Maintain a healthy diet. Do not change your diet before surgery unless your health care provider tells you to do that.  Do not drink any alcohol for at least 48 hours before surgery. Plan your recovery In the first couple of weeks after surgery, it will be harder for you to do some of your regular activities. You may get tired easily, and you will have limited movement in  your leg. To make sure you have all the help you need after your surgery:  Plan to have a responsible adult take you home from the hospital. Your health care provider will tell you how many days you can expect to be in the hospital.  Cancel all work, caregiving, and volunteer responsibilities for at least 4-6 weeks after surgery.  Plan to have a responsible adult stay with you day and night for the first week. This person should be someone you are comfortable with. You may need this person to help you with your exercises and personal care, such as bathing and using the toilet.  If you live alone, arrange for someone to take care of your home and pets for the first 4-6 weeks after surgery.  Arrange for drivers to take you to follow-up visits, the grocery store, and other places you may need to go for at least 4-6 weeks.  Consider applying for a disability parking permit. To get an application, call your local department of motor vehicles [facility]) or your health care provider's office. Summary  Getting prepared before knee replacement surgery can make your recovery easier and more comfortable.  Keep all visits to your health care provider before surgery. You will have an exam and may have tests to make sure that you are ready for your surgery.  Prepare your home and arrange for help at home.  Plan to have a responsible adult take you home from the hospital. Also, plan to have a responsible adult stay with you day and night for the first week after you leave the hospital. This information is not intended to replace advice given to you by your health care provider. Make sure you discuss any questions you have with your health care provider. Document Revised: 12/27/2019 Document Reviewed: 12/27/2019 Elsevier Patient Education  2021 [employer].

## 2020-10-04 ENCOUNTER — Encounter (HOSPITAL_COMMUNITY)
Admission: RE | Admit: 2020-10-04 | Discharge: 2020-10-04 | Disposition: A | Discharge status: Home / Self Care | Payer: BC Managed Care – PPO | Source: Ambulatory Visit | Attending: Orthopedic Surgery | Admitting: Orthopedic Surgery

## 2020-10-04 ENCOUNTER — Encounter (HOSPITAL_COMMUNITY): Payer: Self-pay

## 2020-10-04 ENCOUNTER — Other Ambulatory Visit: Payer: Self-pay

## 2020-10-04 DIAGNOSIS — Z01812 Encounter for preprocedural laboratory examination: Secondary | ICD-10-CM | POA: Insufficient documentation

## 2020-10-04 HISTORY — DX: Sleep apnea, unspecified: G47.30

## 2020-10-04 HISTORY — DX: Essential (primary) hypertension: I10

## 2020-10-04 HISTORY — DX: Type 2 diabetes mellitus without complications: E11.9

## 2020-10-04 HISTORY — DX: Other complications of anesthesia, initial encounter: T88.59XA

## 2020-10-04 LAB — TYPE AND SCREEN
ABO/RH(D): O POS
Antibody Screen: NEGATIVE

## 2020-10-04 LAB — CBC WITH DIFFERENTIAL/PLATELET
Abs Immature Granulocytes: 0.03 10*3/uL (ref 0.00–0.07)
Basophils Absolute: 0 10*3/uL (ref 0.0–0.1)
Basophils Relative: 0 %
Eosinophils Absolute: 0 10*3/uL (ref 0.0–0.5)
Eosinophils Relative: 1 %
HCT: 44.5 % (ref 39.0–52.0)
Hemoglobin: 14.3 g/dL (ref 13.0–17.0)
Immature Granulocytes: 0 %
Lymphocytes Relative: 22 %
Lymphs Abs: 1.7 10*3/uL (ref 0.7–4.0)
MCH: 29.4 pg (ref 26.0–34.0)
MCHC: 32.1 g/dL (ref 30.0–36.0)
MCV: 91.4 fL (ref 80.0–100.0)
Monocytes Absolute: 0.6 10*3/uL (ref 0.1–1.0)
Monocytes Relative: 7 %
Neutro Abs: 5.3 10*3/uL (ref 1.7–7.7)
Neutrophils Relative %: 70 %
Platelets: 301 10*3/uL (ref 150–400)
RBC: 4.87 MIL/uL (ref 4.22–5.81)
RDW: 13.3 % (ref 11.5–15.5)
WBC: 7.6 10*3/uL (ref 4.0–10.5)
nRBC: 0 % (ref 0.0–0.2)

## 2020-10-04 LAB — BASIC METABOLIC PANEL
Anion gap: 11 (ref 5–15)
BUN: 19 mg/dL (ref 6–20)
CO2: 24 mmol/L (ref 22–32)
Calcium: 9.6 mg/dL (ref 8.9–10.3)
Chloride: 101 mmol/L (ref 98–111)
Creatinine, Ser: 1.29 mg/dL — ABNORMAL HIGH (ref 0.61–1.24)
GFR, Estimated: >60 mL/min (ref >60)
Glucose, Bld: 90 mg/dL (ref 70–99)
Potassium: 4 mmol/L (ref 3.5–5.1)
Sodium: 136 mmol/L (ref 135–145)

## 2020-10-04 LAB — PREPARE RBC (CROSSMATCH)

## 2020-10-08 ENCOUNTER — Other Ambulatory Visit: Payer: Self-pay

## 2020-10-08 ENCOUNTER — Other Ambulatory Visit (HOSPITAL_COMMUNITY)
Admission: RE | Admit: 2020-10-08 | Discharge: 2020-10-08 | Disposition: A | Discharge status: Home / Self Care | Payer: BC Managed Care – PPO | Source: Ambulatory Visit | Attending: Orthopedic Surgery | Admitting: Orthopedic Surgery

## 2020-10-08 DIAGNOSIS — Z01812 Encounter for preprocedural laboratory examination: Secondary | ICD-10-CM | POA: Insufficient documentation

## 2020-10-08 DIAGNOSIS — Z20822 Contact with and (suspected) exposure to covid-19: Secondary | ICD-10-CM | POA: Insufficient documentation

## 2020-10-08 LAB — SARS CORONAVIRUS 2 (TAT 6-24 HRS): SARS Coronavirus 2: NEGATIVE

## 2020-10-09 ENCOUNTER — Ambulatory Visit (HOSPITAL_COMMUNITY): Payer: BC Managed Care – PPO

## 2020-10-09 ENCOUNTER — Observation Stay (HOSPITAL_COMMUNITY)
Admission: RE | Admit: 2020-10-09 | Discharge: 2020-10-10 | Disposition: A | Discharge status: Home / Self Care | Payer: BC Managed Care – PPO | Source: Ambulatory Visit | Attending: Orthopedic Surgery | Admitting: Orthopedic Surgery

## 2020-10-09 ENCOUNTER — Ambulatory Visit (HOSPITAL_COMMUNITY): Payer: BC Managed Care – PPO | Admitting: Anesthesiology

## 2020-10-09 ENCOUNTER — Encounter (HOSPITAL_COMMUNITY): Payer: Self-pay | Admitting: Orthopedic Surgery

## 2020-10-09 ENCOUNTER — Encounter (HOSPITAL_COMMUNITY)
Admission: RE | Disposition: A | Discharge status: Home / Self Care | Payer: Self-pay | Source: Ambulatory Visit | Attending: Orthopedic Surgery

## 2020-10-09 DIAGNOSIS — G4730 Sleep apnea, unspecified: Secondary | ICD-10-CM | POA: Diagnosis not present

## 2020-10-09 DIAGNOSIS — E785 Hyperlipidemia, unspecified: Secondary | ICD-10-CM | POA: Insufficient documentation

## 2020-10-09 DIAGNOSIS — I10 Essential (primary) hypertension: Secondary | ICD-10-CM | POA: Diagnosis not present

## 2020-10-09 DIAGNOSIS — Z79899 Other long term (current) drug therapy: Secondary | ICD-10-CM | POA: Diagnosis not present

## 2020-10-09 DIAGNOSIS — M1711 Unilateral primary osteoarthritis, right knee: Principal | ICD-10-CM

## 2020-10-09 DIAGNOSIS — Z89429 Acquired absence of other toe(s), unspecified side: Secondary | ICD-10-CM | POA: Diagnosis not present

## 2020-10-09 DIAGNOSIS — E119 Type 2 diabetes mellitus without complications: Secondary | ICD-10-CM | POA: Insufficient documentation

## 2020-10-09 DIAGNOSIS — G8918 Other acute postprocedural pain: Secondary | ICD-10-CM | POA: Diagnosis not present

## 2020-10-09 DIAGNOSIS — Z96651 Presence of right artificial knee joint: Secondary | ICD-10-CM | POA: Diagnosis not present

## 2020-10-09 DIAGNOSIS — Z9889 Other specified postprocedural states: Secondary | ICD-10-CM | POA: Diagnosis not present

## 2020-10-09 DIAGNOSIS — Z791 Long term (current) use of non-steroidal anti-inflammatories (NSAID): Secondary | ICD-10-CM | POA: Insufficient documentation

## 2020-10-09 DIAGNOSIS — Z471 Aftercare following joint replacement surgery: Secondary | ICD-10-CM | POA: Diagnosis not present

## 2020-10-09 DIAGNOSIS — J45909 Unspecified asthma, uncomplicated: Secondary | ICD-10-CM | POA: Insufficient documentation

## 2020-10-09 DIAGNOSIS — M25561 Pain in right knee: Secondary | ICD-10-CM | POA: Diagnosis not present

## 2020-10-09 HISTORY — PX: TOTAL KNEE ARTHROPLASTY: SHX125

## 2020-10-09 LAB — GLUCOSE, CAPILLARY
Glucose-Capillary: 120 mg/dL — ABNORMAL HIGH (ref 70–99)
Glucose-Capillary: 120 mg/dL — ABNORMAL HIGH (ref 70–99)
Glucose-Capillary: 106 mg/dL — ABNORMAL HIGH (ref 70–99)

## 2020-10-09 SURGERY — ARTHROPLASTY, KNEE, TOTAL
Anesthesia: Spinal | Site: Knee | Laterality: Right

## 2020-10-09 MED ORDER — ONDANSETRON HCL 4 MG/2ML IJ SOLN
4.0000 mg | Freq: Once | INTRAMUSCULAR | Status: AC
  Administered 2020-10-09: 4 mg via INTRAVENOUS
  Filled 2020-10-09: qty 2

## 2020-10-09 MED ORDER — FENTANYL CITRATE (PF) 100 MCG/2ML IJ SOLN
INTRAMUSCULAR | Status: DC | PRN
  Administered 2020-10-09: 12.5 ug via INTRAVENOUS
  Administered 2020-10-09: 25 ug via INTRAVENOUS
  Administered 2020-10-09 (×3): 12.5 ug via INTRAVENOUS

## 2020-10-09 MED ORDER — PANTOPRAZOLE SODIUM 40 MG PO TBEC
40.0000 mg | DELAYED_RELEASE_TABLET | Freq: Every day | ORAL | Status: DC
  Administered 2020-10-09 – 2020-10-10 (×2): 40 mg via ORAL
  Filled 2020-10-09 (×2): qty 1

## 2020-10-09 MED ORDER — EPHEDRINE 5 MG/ML INJ
INTRAVENOUS | Status: AC
  Filled 2020-10-09: qty 10

## 2020-10-09 MED ORDER — CELECOXIB 100 MG PO CAPS
200.0000 mg | ORAL_CAPSULE | Freq: Two times a day (BID) | ORAL | Status: DC
  Administered 2020-10-09 – 2020-10-10 (×2): 200 mg via ORAL
  Filled 2020-10-09 (×2): qty 2

## 2020-10-09 MED ORDER — PROPOFOL 10 MG/ML IV BOLUS
INTRAVENOUS | Status: AC
  Filled 2020-10-09: qty 40

## 2020-10-09 MED ORDER — POVIDONE-IODINE 10 % EX SWAB: 2.0000 "application " | Freq: Once | CUTANEOUS | Status: DC

## 2020-10-09 MED ORDER — TRANEXAMIC ACID-NACL 1000-0.7 MG/100ML-% IV SOLN
1000.0000 mg | Freq: Once | INTRAVENOUS | Status: AC
  Administered 2020-10-09: 1000 mg via INTRAVENOUS
  Filled 2020-10-09: qty 100

## 2020-10-09 MED ORDER — ACETAMINOPHEN 500 MG PO TABS
500.0000 mg | ORAL_TABLET | Freq: Four times a day (QID) | ORAL | Status: AC
  Administered 2020-10-09 – 2020-10-10 (×4): 500 mg via ORAL
  Filled 2020-10-09 (×4): qty 1

## 2020-10-09 MED ORDER — CHLORHEXIDINE GLUCONATE 0.12 % MT SOLN: 15.0000 mL | Freq: Once | OROMUCOSAL | Status: AC

## 2020-10-09 MED ORDER — LISINOPRIL 10 MG PO TABS
20.0000 mg | ORAL_TABLET | Freq: Every day | ORAL | Status: DC
Start: 2020-10-09 — End: 2020-10-10
  Administered 2020-10-09 – 2020-10-10 (×2): 20 mg via ORAL
  Filled 2020-10-09 (×2): qty 2

## 2020-10-09 MED ORDER — MIDAZOLAM HCL 2 MG/2ML IJ SOLN
INTRAMUSCULAR | Status: AC
  Filled 2020-10-09: qty 2

## 2020-10-09 MED ORDER — ORAL CARE MOUTH RINSE: 15.0000 mL | Freq: Once | OROMUCOSAL | Status: AC

## 2020-10-09 MED ORDER — ONDANSETRON HCL 4 MG/2ML IJ SOLN
INTRAMUSCULAR | Status: AC
  Filled 2020-10-09: qty 2

## 2020-10-09 MED ORDER — CELECOXIB 400 MG PO CAPS
400.0000 mg | ORAL_CAPSULE | Freq: Once | ORAL | Status: AC
  Administered 2020-10-09: 400 mg via ORAL
  Filled 2020-10-09: qty 1

## 2020-10-09 MED ORDER — B COMPLEX-C PO TABS
1.0000 | ORAL_TABLET | Freq: Every day | ORAL | Status: DC
  Administered 2020-10-09 – 2020-10-10 (×2): 1 via ORAL
  Filled 2020-10-09 (×2): qty 1

## 2020-10-09 MED ORDER — ONDANSETRON HCL 4 MG/2ML IJ SOLN
4.0000 mg | Freq: Once | INTRAMUSCULAR | Status: AC | PRN
  Administered 2020-10-09: 4 mg via INTRAVENOUS
  Filled 2020-10-09: qty 2

## 2020-10-09 MED ORDER — MORPHINE SULFATE (PF) 2 MG/ML IV SOLN: 0.5000 mg | INTRAVENOUS | Status: DC | PRN

## 2020-10-09 MED ORDER — CHLORHEXIDINE GLUCONATE CLOTH 2 % EX PADS
6.0000 | MEDICATED_PAD | Freq: Every day | CUTANEOUS | Status: DC
  Administered 2020-10-09: 6 via TOPICAL

## 2020-10-09 MED ORDER — VANCOMYCIN HCL IN DEXTROSE 1-5 GM/200ML-% IV SOLN
INTRAVENOUS | Status: AC
  Administered 2020-10-09: 1000 mg via INTRAVENOUS
  Filled 2020-10-09: qty 200

## 2020-10-09 MED ORDER — PROPOFOL 500 MG/50ML IV EMUL
INTRAVENOUS | Status: DC | PRN
  Administered 2020-10-09: 35 ug/kg/min via INTRAVENOUS
  Administered 2020-10-09: 50 ug/kg/min via INTRAVENOUS

## 2020-10-09 MED ORDER — ROPIVACAINE HCL 5 MG/ML IJ SOLN
INTRAMUSCULAR | Status: AC
  Filled 2020-10-09: qty 30

## 2020-10-09 MED ORDER — SODIUM CHLORIDE 0.9 % IV SOLN: INTRAVENOUS | Status: AC

## 2020-10-09 MED ORDER — BUPIVACAINE HCL (PF) 0.5 % IJ SOLN: INTRAMUSCULAR | Status: DC | PRN

## 2020-10-09 MED ORDER — EPINEPHRINE 1 MG/10ML IJ SOSY
PREFILLED_SYRINGE | INTRAMUSCULAR | Status: DC | PRN
  Administered 2020-10-09: .01 mg via INTRAVENOUS

## 2020-10-09 MED ORDER — TRANEXAMIC ACID-NACL 1000-0.7 MG/100ML-% IV SOLN
1000.0000 mg | INTRAVENOUS | Status: AC
  Administered 2020-10-09: 1000 mg via INTRAVENOUS
  Filled 2020-10-09: qty 100

## 2020-10-09 MED ORDER — FENTANYL CITRATE (PF) 100 MCG/2ML IJ SOLN
INTRAMUSCULAR | Status: AC
  Filled 2020-10-09: qty 2

## 2020-10-09 MED ORDER — ONDANSETRON HCL 4 MG PO TABS: 4.0000 mg | ORAL_TABLET | Freq: Four times a day (QID) | ORAL | Status: DC | PRN

## 2020-10-09 MED ORDER — METOCLOPRAMIDE HCL 5 MG/ML IJ SOLN: 5.0000 mg | Freq: Three times a day (TID) | INTRAMUSCULAR | Status: DC | PRN

## 2020-10-09 MED ORDER — LISINOPRIL-HYDROCHLOROTHIAZIDE 20-12.5 MG PO TABS: 2.0000 | ORAL_TABLET | Freq: Every day | ORAL | Status: DC

## 2020-10-09 MED ORDER — METHOCARBAMOL 500 MG PO TABS
500.0000 mg | ORAL_TABLET | Freq: Four times a day (QID) | ORAL | Status: DC | PRN
  Administered 2020-10-10: 500 mg via ORAL
  Filled 2020-10-09: qty 1

## 2020-10-09 MED ORDER — ASPIRIN 81 MG PO CHEW
81.0000 mg | CHEWABLE_TABLET | Freq: Two times a day (BID) | ORAL | Status: DC
  Administered 2020-10-09 – 2020-10-10 (×2): 81 mg via ORAL
  Filled 2020-10-09 (×2): qty 1

## 2020-10-09 MED ORDER — LACTATED RINGERS IV SOLN: INTRAVENOUS | Status: DC

## 2020-10-09 MED ORDER — HYDROMORPHONE HCL 1 MG/ML IJ SOLN
0.2500 mg | INTRAMUSCULAR | Status: DC | PRN
  Administered 2020-10-09 (×4): 0.5 mg via INTRAVENOUS
  Filled 2020-10-09 (×4): qty 0.5

## 2020-10-09 MED ORDER — PROPOFOL 10 MG/ML IV BOLUS
INTRAVENOUS | Status: DC | PRN
  Administered 2020-10-09: 15 mg via INTRAVENOUS
  Administered 2020-10-09: 20 mg via INTRAVENOUS

## 2020-10-09 MED ORDER — HYDROCODONE-ACETAMINOPHEN 5-325 MG PO TABS
1.0000 | ORAL_TABLET | ORAL | Status: DC | PRN
  Administered 2020-10-10 (×2): 1 via ORAL
  Filled 2020-10-09: qty 1
  Filled 2020-10-09: qty 2

## 2020-10-09 MED ORDER — DOCUSATE SODIUM 100 MG PO CAPS
100.0000 mg | ORAL_CAPSULE | Freq: Two times a day (BID) | ORAL | Status: DC
  Administered 2020-10-09 – 2020-10-10 (×2): 100 mg via ORAL
  Filled 2020-10-09 (×2): qty 1

## 2020-10-09 MED ORDER — VANCOMYCIN HCL IN DEXTROSE 1-5 GM/200ML-% IV SOLN
1000.0000 mg | INTRAVENOUS | Status: AC
  Administered 2020-10-09: 1000 mg via INTRAVENOUS

## 2020-10-09 MED ORDER — LIDOCAINE HCL (PF) 1 % IJ SOLN
INTRAMUSCULAR | Status: AC
  Filled 2020-10-09: qty 30

## 2020-10-09 MED ORDER — ESCITALOPRAM OXALATE 10 MG PO TABS
10.0000 mg | ORAL_TABLET | Freq: Every day | ORAL | Status: DC
  Administered 2020-10-09 – 2020-10-10 (×2): 10 mg via ORAL
  Filled 2020-10-09 (×2): qty 1

## 2020-10-09 MED ORDER — METOCLOPRAMIDE HCL 10 MG PO TABS: 5.0000 mg | ORAL_TABLET | Freq: Three times a day (TID) | ORAL | Status: DC | PRN

## 2020-10-09 MED ORDER — BUPIVACAINE IN DEXTROSE 0.75-8.25 % IT SOLN
INTRATHECAL | Status: DC | PRN
  Administered 2020-10-09: 15 mg via INTRATHECAL

## 2020-10-09 MED ORDER — PREGABALIN 50 MG PO CAPS
50.0000 mg | ORAL_CAPSULE | Freq: Once | ORAL | Status: AC
  Administered 2020-10-09: 50 mg via ORAL
  Filled 2020-10-09: qty 1

## 2020-10-09 MED ORDER — HYDROCHLOROTHIAZIDE 12.5 MG PO CAPS
12.5000 mg | ORAL_CAPSULE | Freq: Every day | ORAL | Status: DC
  Administered 2020-10-09 – 2020-10-10 (×2): 12.5 mg via ORAL
  Filled 2020-10-09 (×2): qty 1

## 2020-10-09 MED ORDER — METFORMIN HCL ER 500 MG PO TB24
500.0000 mg | ORAL_TABLET | Freq: Every day | ORAL | Status: DC
  Administered 2020-10-09: 500 mg via ORAL
  Filled 2020-10-09: qty 1

## 2020-10-09 MED ORDER — ONDANSETRON HCL 4 MG/2ML IJ SOLN
INTRAMUSCULAR | Status: DC | PRN
  Administered 2020-10-09: 4 mg via INTRAVENOUS

## 2020-10-09 MED ORDER — DIPHENHYDRAMINE HCL 12.5 MG/5ML PO ELIX
12.5000 mg | ORAL_SOLUTION | ORAL | Status: DC | PRN
Start: 2020-10-09 — End: 2020-10-10

## 2020-10-09 MED ORDER — ROPIVACAINE HCL 5 MG/ML IJ SOLN
INTRAMUSCULAR | Status: DC | PRN
  Administered 2020-10-09: 20 mL via EPIDURAL

## 2020-10-09 MED ORDER — METHOCARBAMOL 1000 MG/10ML IJ SOLN
500.0000 mg | Freq: Once | INTRAVENOUS | Status: AC
  Administered 2020-10-09: 500 mg via INTRAVENOUS
  Filled 2020-10-09: qty 5

## 2020-10-09 MED ORDER — SODIUM CHLORIDE 0.9 % IR SOLN
Status: DC | PRN
  Administered 2020-10-09: 3000 mL

## 2020-10-09 MED ORDER — 0.9 % SODIUM CHLORIDE (POUR BTL) OPTIME
TOPICAL | Status: DC | PRN
  Administered 2020-10-09: 1000 mL

## 2020-10-09 MED ORDER — MIDAZOLAM HCL 5 MG/5ML IJ SOLN
INTRAMUSCULAR | Status: DC | PRN
  Administered 2020-10-09: 2 mg via INTRAVENOUS

## 2020-10-09 MED ORDER — HYDROCODONE-ACETAMINOPHEN 7.5-325 MG PO TABS
1.0000 | ORAL_TABLET | ORAL | Status: DC | PRN
  Administered 2020-10-09 – 2020-10-10 (×2): 2 via ORAL
  Filled 2020-10-09 (×2): qty 2

## 2020-10-09 MED ORDER — INSULIN ASPART 100 UNIT/ML ~~LOC~~ SOLN: 0.0000 [IU] | Freq: Three times a day (TID) | SUBCUTANEOUS | Status: DC

## 2020-10-09 MED ORDER — PHENOL 1.4 % MT LIQD: 1.0000 | OROMUCOSAL | Status: DC | PRN

## 2020-10-09 MED ORDER — ROSUVASTATIN CALCIUM 20 MG PO TABS
40.0000 mg | ORAL_TABLET | Freq: Every day | ORAL | Status: DC
  Administered 2020-10-09: 40 mg via ORAL
  Filled 2020-10-09: qty 2

## 2020-10-09 MED ORDER — MENTHOL 3 MG MT LOZG: 1.0000 | LOZENGE | OROMUCOSAL | Status: DC | PRN

## 2020-10-09 MED ORDER — ONDANSETRON HCL 4 MG/2ML IJ SOLN: 4.0000 mg | Freq: Four times a day (QID) | INTRAMUSCULAR | Status: DC | PRN

## 2020-10-09 MED ORDER — PHENYLEPHRINE 40 MCG/ML (10ML) SYRINGE FOR IV PUSH (FOR BLOOD PRESSURE SUPPORT)
PREFILLED_SYRINGE | INTRAVENOUS | Status: AC
  Filled 2020-10-09: qty 20

## 2020-10-09 MED ORDER — VANCOMYCIN HCL IN DEXTROSE 1-5 GM/200ML-% IV SOLN
1000.0000 mg | Freq: Two times a day (BID) | INTRAVENOUS | Status: AC
Start: 2020-10-09 — End: 2020-10-09
  Filled 2020-10-09: qty 200

## 2020-10-09 MED ORDER — FENTANYL CITRATE (PF) 100 MCG/2ML IJ SOLN
INTRAMUSCULAR | Status: DC | PRN
  Administered 2020-10-09: 25 ug via INTRATHECAL

## 2020-10-09 MED ORDER — ACETAMINOPHEN 325 MG PO TABS: 325.0000 mg | ORAL_TABLET | Freq: Four times a day (QID) | ORAL | Status: DC | PRN

## 2020-10-09 MED ORDER — BUPIVACAINE-MELOXICAM ER 400-12 MG/14ML IJ SOLN
INTRAMUSCULAR | Status: DC | PRN
  Administered 2020-10-09: 400 mg

## 2020-10-09 MED ORDER — TRAMADOL HCL 50 MG PO TABS
50.0000 mg | ORAL_TABLET | Freq: Four times a day (QID) | ORAL | Status: DC
Start: 2020-10-09 — End: 2020-10-10
  Administered 2020-10-09 – 2020-10-10 (×4): 50 mg via ORAL
  Filled 2020-10-09 (×4): qty 1

## 2020-10-09 MED ORDER — EPINEPHRINE PF 1 MG/ML IJ SOLN
INTRAMUSCULAR | Status: AC
  Filled 2020-10-09: qty 1

## 2020-10-09 MED ORDER — CHLORHEXIDINE GLUCONATE 0.12 % MT SOLN
OROMUCOSAL | Status: AC
  Administered 2020-10-09: 15 mL via OROMUCOSAL
  Filled 2020-10-09: qty 30

## 2020-10-09 MED ORDER — HYDROCODONE-ACETAMINOPHEN 7.5-325 MG PO TABS
1.0000 | ORAL_TABLET | Freq: Once | ORAL | Status: AC
Start: 2020-10-09 — End: 2020-10-09
  Administered 2020-10-09: 1 via ORAL
  Filled 2020-10-09: qty 1

## 2020-10-09 MED ORDER — METHOCARBAMOL 1000 MG/10ML IJ SOLN
500.0000 mg | Freq: Four times a day (QID) | INTRAVENOUS | Status: DC | PRN
  Filled 2020-10-09: qty 5

## 2020-10-09 MED ORDER — SENNOSIDES-DOCUSATE SODIUM 8.6-50 MG PO TABS: 1.0000 | ORAL_TABLET | Freq: Every evening | ORAL | Status: DC | PRN

## 2020-10-09 MED ORDER — DEXAMETHASONE SODIUM PHOSPHATE 10 MG/ML IJ SOLN
INTRAMUSCULAR | Status: AC
  Filled 2020-10-09: qty 1

## 2020-10-09 SURGICAL SUPPLY — 75 items
ATTUNE MED DOME PAT 38 KNEE (Knees) ×1 IMPLANT
ATTUNE PS FEM RT SZ 6 CEM KNEE (Femur) ×1 IMPLANT
BANDAGE ELASTIC 4 VELCRO NS (GAUZE/BANDAGES/DRESSINGS) ×2 IMPLANT
BANDAGE ELASTIC 6 VELCRO NS (GAUZE/BANDAGES/DRESSINGS) ×1 IMPLANT
BANDAGE ESMARK 6X9 LF (GAUZE/BANDAGES/DRESSINGS) ×1 IMPLANT
BASE TIBIAL CEM ATTUNE SZ 7 (Knees) ×2 IMPLANT
BASEPLATE TIB CEM ATTUNE SZ7 (Knees) IMPLANT
BIT DRILL 3.2X128 (BIT) IMPLANT
BLADE HEX COATED 2.75 (ELECTRODE) ×2 IMPLANT
BLADE SAGITTAL 25.0X1.27X90 (BLADE) ×2 IMPLANT
BLADE SAW SGTL 11.0X1.19X90.0M (BLADE) ×2 IMPLANT
BNDG CMPR 9X6 STRL LF SNTH (GAUZE/BANDAGES/DRESSINGS) ×1
BNDG CMPR STD VLCR NS LF 5.8X4 (GAUZE/BANDAGES/DRESSINGS) ×2
BNDG CMPR STD VLCR NS LF 5.8X6 (GAUZE/BANDAGES/DRESSINGS) ×1
BNDG ELASTIC 4X5.8 VLCR NS LF (GAUZE/BANDAGES/DRESSINGS) ×4 IMPLANT
BNDG ELASTIC 6X5.8 VLCR NS LF (GAUZE/BANDAGES/DRESSINGS) ×2 IMPLANT
BNDG ESMARK 6X9 LF (GAUZE/BANDAGES/DRESSINGS) ×2
BOWL SMART MIX CTS (DISPOSABLE) IMPLANT
BSPLAT TIB 7 CMNT FX BRNG STRL (Knees) ×1 IMPLANT
CEMENT HV SMART SET (Cement) ×4 IMPLANT
CLOTH BEACON ORANGE TIMEOUT ST (SAFETY) ×2 IMPLANT
COOLER ICEMAN CLASSIC (MISCELLANEOUS) ×2 IMPLANT
COVER LIGHT HANDLE STERIS (MISCELLANEOUS) ×4 IMPLANT
COVER WAND RF STERILE (DRAPES) ×2 IMPLANT
CUFF TOURN SGL QUICK 34 (TOURNIQUET CUFF) ×2
CUFF TRNQT CYL 34X4.125X (TOURNIQUET CUFF) ×1 IMPLANT
DECANTER SPIKE VIAL GLASS SM (MISCELLANEOUS) IMPLANT
DRAPE BACK TABLE (DRAPES) ×2 IMPLANT
DRAPE EXTREMITY T 121X128X90 (DISPOSABLE) ×2 IMPLANT
DRSG MEPILEX BORDER 4X12 (GAUZE/BANDAGES/DRESSINGS) ×2 IMPLANT
DURAPREP 26ML APPLICATOR (WOUND CARE) ×4 IMPLANT
ELECT REM PT RETURN 9FT ADLT (ELECTROSURGICAL) ×2
ELECTRODE REM PT RTRN 9FT ADLT (ELECTROSURGICAL) ×1 IMPLANT
GLOVE SKINSENSE NS SZ8.0 LF (GLOVE) ×2
GLOVE SKINSENSE STRL SZ8.0 LF (GLOVE) ×2 IMPLANT
GLOVE SS N UNI LF 8.5 STRL (GLOVE) ×2 IMPLANT
GLOVE SURG UNDER POLY LF SZ7 (GLOVE) ×4 IMPLANT
GOWN STRL REUS W/TWL LRG LVL3 (GOWN DISPOSABLE) ×6 IMPLANT
GOWN STRL REUS W/TWL XL LVL3 (GOWN DISPOSABLE) ×2 IMPLANT
HANDPIECE INTERPULSE COAX TIP (DISPOSABLE) ×2
HOOD W/PEELAWAY (MISCELLANEOUS) ×8 IMPLANT
INSERT TIB PS FB ATTUNE SZ6X5 (Knees) ×1 IMPLANT
INST SET MAJOR BONE (KITS) ×2 IMPLANT
IV NS IRRIG 3000ML ARTHROMATIC (IV SOLUTION) ×2 IMPLANT
KIT BLADEGUARD II DBL (SET/KITS/TRAYS/PACK) ×2 IMPLANT
KIT TURNOVER KIT A (KITS) ×2 IMPLANT
MANIFOLD NEPTUNE II (INSTRUMENTS) ×2 IMPLANT
MARKER SKIN DUAL TIP RULER LAB (MISCELLANEOUS) ×2 IMPLANT
NDL HYPO 18GX1.5 BLUNT FILL (NEEDLE) IMPLANT
NDL HYPO 21X1.5 SAFETY (NEEDLE) IMPLANT
NEEDLE HYPO 18GX1.5 BLUNT FILL (NEEDLE) IMPLANT
NEEDLE HYPO 21X1.5 SAFETY (NEEDLE) IMPLANT
NS IRRIG 1000ML POUR BTL (IV SOLUTION) ×2 IMPLANT
PACK TOTAL JOINT (CUSTOM PROCEDURE TRAY) ×2 IMPLANT
PAD ARMBOARD 7.5X6 YLW CONV (MISCELLANEOUS) ×2 IMPLANT
PAD COLD SHLDR UNI XL WRAP-ON (PAD) ×2
PAD COLD UNI XL WRAP-ON (PAD) IMPLANT
PENCIL SMOKE EVACUATOR (MISCELLANEOUS) ×2 IMPLANT
PILLOW KNEE EXTENSION 0 DEG (MISCELLANEOUS) ×2 IMPLANT
PIN/DRILL PACK ORTHO 1/8X3.0 (PIN) ×2 IMPLANT
SAW OSC TIP CART 19.5X105X1.3 (SAW) ×2 IMPLANT
SET BASIN LINEN APH (SET/KITS/TRAYS/PACK) ×2 IMPLANT
SET HNDPC FAN SPRY TIP SCT (DISPOSABLE) ×1 IMPLANT
STAPLER VISISTAT 35W (STAPLE) ×2 IMPLANT
SUT BRALON NAB BRD #1 30IN (SUTURE) ×2 IMPLANT
SUT MNCRL 0 VIOLET CTX 36 (SUTURE) ×1 IMPLANT
SUT MON AB 0 CT1 (SUTURE) ×2 IMPLANT
SUT MONOCRYL 0 CTX 36 (SUTURE) ×2
SYR 20ML LL LF (SYRINGE) IMPLANT
SYR BULB IRRIG 60ML STRL (SYRINGE) ×2 IMPLANT
TOWEL OR 17X26 4PK STRL BLUE (TOWEL DISPOSABLE) ×2 IMPLANT
TOWER CARTRIDGE SMART MIX (DISPOSABLE) ×2 IMPLANT
TRAY FOLEY MTR SLVR 16FR STAT (SET/KITS/TRAYS/PACK) ×2 IMPLANT
WATER STERILE IRR 1000ML POUR (IV SOLUTION) ×4 IMPLANT
YANKAUER SUCT 12FT TUBE ARGYLE (SUCTIONS) ×2 IMPLANT

## 2020-10-09 NOTE — Brief Op Note (Signed)
10/09/2020   10:16 AM   PATIENT:  Luis Ford  59 y.o. male   PRE-OPERATIVE DIAGNOSIS:  Right knee osteoarthritis   POST-OPERATIVE DIAGNOSIS:  Right knee osteoarthritis   PROCEDURE:  Procedure(s): RIGHT TOTAL KNEE ARTHROPLASTY (Right)    DEPUY PS FB ATTUNE  6F  7 T 5 POLY 38 P   BONE CUTS  9 F  3 TIBIA MEDIAL SIDE PATELLA 33 TO 13    FINDINGS    Preop full extension was noted he had a varus deformity less than 10 degrees it was passively correctable    Intra-Op he had severe wear of the medial side medial.  We noted grade 4 wear on the tibia and femur and trochlea grade 2 on the patella lateral side was intact   He had intact medial lateral meniscus as well as an intact ACL and PCL    SURGEON:  Surgeon(s) and Role:    * Galileo Colello E, MD - Primary   PHYSICIAN ASSISTANT:    ASSISTANTS: DEBBIE DALLAS    ANESTHESIA:   spinal THEN IN PACU ADDUCTOR CANAL BLOCK    EBL:  10 mL    BLOOD ADMINISTERED:none   DRAINS: none    LOCAL MEDICATIONS USED:  OTHER    SPECIMEN:  No Specimen   DISPOSITION OF SPECIMEN:  N/A   COUNTS:  YES   TOURNIQUET:   Total Tourniquet Time Documented: Thigh (Right) - 92 minutes Total: Thigh (Right) - 92 minutes     DICTATION: .Dragon Dictation   PLAN OF CARE: Admit to inpatient    PATIENT DISPOSITION:  PACU - hemodynamically stable.   Delay start of Pharmacological VTE agent (>24hrs) due to surgical blood loss or risk of bleeding: yes   Dictation of the procedure   Details of surgery: The patient was identified by 2 approved identification mechanisms. The operative extremity was evaluated and found to be acceptable for surgical treatment today. The chart was reviewed. The surgical site was confirmed and marked.     The patient was taken to the operating room and given appropriate antibiotic vancomycin secondary to penicillin allergy. This is consistent with the SCIP protocol.   The patient was given the following  anesthetic: Spinal   The patient was then placed supine on the operating table. A Foley catheter was inserted. The operative extremity was prepped and draped sterilely from the toes to the groin.   Timeout was executed confirming the patient's name, surgical site, antibiotic administration, x-rays available, and implants available.   The operative limb,  was exsanguinated with a six-inch Esmarch and the tourniquet was inflated to 280 mmHg.   A straight midline incision was made over the right KNEE and taken down to the extensor mechanism. A medial arthrotomy was performed. The patella was everted and the patellofemoral soft tissue was released, along with the patellar fat pad.  The anterior cruciate ligament and PCL were resected.   The anterior horns of the lateral and medial meniscus were resected. The medial soft tissue sleeve was elevated to the mid coronal plane.   A three-eighths inch drill bit was used to enter the femoral canal which was decompressed with suction and irrigation until clear.    The distal femoral cutting guide was set for 9 mm distal resection,  5valgus alignment, for a right knee. The distal femur was resected and checked for flatness.   The Depuy Sigma sizing femoral guide was placed and the femur was sized to a size 6.      The external alignment guide for the tibial resection was then applied to the distal and proximal tibia and set for anatomic slope along with 3 MM resection  from the  MEDIAL  .    Rotational alignment was set using the malleolus, the tibial tubercle and the tibial spines.   The proximal tibia was resected along with  residual menisci. The tibia was sized using a base plate to a size  7 .    The extension gap was checked.  Balance was obtained with a 5 mm spacer block in full extension   We then placed the 4-in-1 cutting block and use the same 5 mm spacer to check the flexion gap cut it looked very good and we proceeded.    with collateral  ligament retractors and the distal femoral cuts were completed.   Spacer blocks were used to confirm equal flexion extension gaps with releases done as needed. A size 5 MM spacer block gave equal stability and flexion extension.   The correct sized notch cutting guide for the femur was then applied and the notch cut was made.   Trial reduction was completed using size 6 femur, 7 tibia, 5 mm insert trial implants. Patella tracking was normal   We then skeletonized the patella. It measured 23 in thickness and the patellar resection was set for 13 millimeters. the patellar resection was completed. The patella diameter measured 38. We then drilled the peg holes for the patella.  Completed patellar thickness was 23   The proximal tibia was prepared using the size 7 base plate.   Thorough irrigation was performed and the bone was dried and prepared for cement. The cement was mixed on the back table using third generation preparation techniques     The implants were then cemented in place and excess cement was removed. The cement was allowed to cure. Irrigation was repeated and excess and residual bone fragments and cement were removed.  The soft tissues were completely dried and then the ZYNRELEF was placed in the posterior part of the knee followed by insert placement and then more medication in the gutters     The extensor mechanism was closed with #1 Bralon suture followed by subcutaneous tissue closure using 0 Monocryl suture     Skin approximation was performed using staples   A sterile dressing was applied, followed by a ace wrapped from foot to thigh and then a Cryo/Cuff which was activated    The patient was taken recovery room in stable condition where anesthesia would provide a abductor canal block    

## 2020-10-09 NOTE — Anesthesia Procedure Notes (Signed)
Date/Time: 10/09/2020 7:50 AM Performed by: Franco Nones, CRNA Pre-anesthesia Checklist: Patient identified, Emergency Drugs available, Suction available, Timeout performed and Patient being monitored Patient Re-evaluated:Patient Re-evaluated prior to induction Oxygen Delivery Method: Non-rebreather mask

## 2020-10-09 NOTE — Anesthesia Procedure Notes (Signed)
Anesthesia Regional Block: Adductor canal block   Pre-Anesthetic Checklist: ,, timeout performed, Correct Patient, Correct Site, Correct Laterality, Correct Procedure, Correct Position, site marked, Risks and benefits discussed,  Surgical consent,  Pre-op evaluation,  At surgeon's request and post-op pain management  Laterality: Right  Prep: chloraprep       Needles:   Needle Type: Stimulator Needle - 80     Needle Length: 9cm  Needle Gauge: 22     Additional Needles:   Procedures:,,,, ultrasound used (permanent image in chart),,,,  Narrative:  Start time: 10/09/2020 10:14 AM End time: 10/09/2020 10:15 AM Injection made incrementally with aspirations every 5 mL.  Performed by: Personally  Anesthesiologist: Windell Norfolk, MD

## 2020-10-09 NOTE — Anesthesia Procedure Notes (Signed)
Date/Time: 10/09/2020 7:33 AM Performed by: Franco Nones, CRNA Pre-anesthesia Checklist: Patient identified, Emergency Drugs available, Suction available, Timeout performed and Patient being monitored Patient Re-evaluated:Patient Re-evaluated prior to induction Oxygen Delivery Method: Nasal Cannula

## 2020-10-09 NOTE — Anesthesia Postprocedure Evaluation (Signed)
Anesthesia Post Note  Patient: Luis Ford  Procedure(s) Performed: RIGHT TOTAL KNEE ARTHROPLASTY (Right Knee)  Patient location during evaluation: PACU Anesthesia Type: Spinal Level of consciousness: awake and alert and patient cooperative Pain management: satisfactory to patient Vital Signs Assessment: post-procedure vital signs reviewed and stable Respiratory status: spontaneous breathing Cardiovascular status: stable Postop Assessment: no apparent nausea or vomiting and spinal receding Anesthetic complications: no   No complications documented.   Last Vitals:  Vitals:   10/09/20 1030 10/09/20 1045  BP: (!) 93/59 98/65  Pulse: 81 63  Resp: 16 (!) 9  Temp:    SpO2: 97% 98%    Last Pain:  Vitals:   10/09/20 1000  TempSrc:   PainSc: 0-No pain                 Kaysee Hergert

## 2020-10-09 NOTE — Plan of Care (Signed)
  Problem: Acute Rehab PT Goals(only PT should resolve) Goal: Pt Will Go Supine/Side To Sit Outcome: Progressing Flowsheets (Taken 10/09/2020 1426) Pt will go Supine/Side to Sit:  Independently  with modified independence Goal: Patient Will Transfer Sit To/From Stand Outcome: Progressing Flowsheets (Taken 10/09/2020 1426) Patient will transfer sit to/from stand: with modified independence Goal: Pt Will Transfer Bed To Chair/Chair To Bed Outcome: Progressing Flowsheets (Taken 10/09/2020 1426) Pt will Transfer Bed to Chair/Chair to Bed: with modified independence Goal: Pt Will Ambulate Outcome: Progressing Flowsheets (Taken 10/09/2020 1426) Pt will Ambulate:  > 125 feet  with modified independence  with rolling walker   2:26 PM, 10/09/20 [name], MPT Physical Therapist with [facility] [phone] office [phone] mobile phone

## 2020-10-09 NOTE — Anesthesia Procedure Notes (Signed)
Spinal  Patient location during procedure: OR Start time: 10/09/2020 7:43 AM End time: 10/09/2020 7:46 AM Reason for block: surgical anesthesia Staffing Performed: resident/CRNA  Anesthesiologist: Windell Norfolk, MD Resident/CRNA: Franco Nones, CRNA Preanesthetic Checklist Completed: patient identified, IV checked, site marked, risks and benefits discussed, surgical consent, monitors and equipment checked, pre-op evaluation and timeout performed Spinal Block Patient position: right lateral decubitus Prep: Betadine Patient monitoring: heart rate, cardiac monitor, continuous pulse ox and blood pressure Approach: right paramedian Location: L3-4 Injection technique: single-shot Needle Needle type: Spinocan  Needle gauge: 24 G Needle length: 9 cm Assessment Sensory level: T8 Events: CSF return Additional Notes ATTEMPTS: 1 TRAY ID: LOT [id number] GTIN [id number] TRAY EXPIRATION DATE: 09-17-2021

## 2020-10-09 NOTE — Evaluation (Signed)
Physical Therapy Evaluation Patient Details Name: Luis Ford MRN: [id number] DOB: 1961/01/31 Today's Date: 10/09/2020  RIGHT KNEE ROM:  0 - 110 degrees AMBULATION DISTANCE: 150 feet using RW with Supervision    History of Present Illness  Luis Ford is a 60 y/o male, s/p Right TKA on 09/11/20  with the diagnosis of Right knee osteoarthritis.  Clinical Impression  Patient instructed in and given written instructions for HEP with good return demonstrated, good tolerance for flexing right knee while seated at bedside up to 110 degrees with pain at baseline, no loss of balance during gait training, limited right heel to toe stepping due to mild increase in right knee and good carryover for going up down steps in stairwell using 1 siderail without loss of balance.  Patient put back to bed with bone foam to right foot after therapy.  Patient will benefit from continued physical therapy in hospital and recommended venue below to increase strength, balance, endurance for safe ADLs and gait.    Follow Up Recommendations Home health PT;Supervision for mobility/OOB;Supervision - Intermittent    Equipment Recommendations  3in1 (PT)    Recommendations for Other Services       Precautions / Restrictions Precautions Precautions: Fall Restrictions Weight Bearing Restrictions: Yes RLE Weight Bearing: Weight bearing as tolerated      Mobility  Bed Mobility Overal bed mobility: Modified Independent                  Transfers Overall transfer level: Needs assistance Equipment used: Rolling walker (2 wheeled) Transfers: Sit to/from [employer] Transfers Sit to Stand: Modified independent (Device/Increase time);Supervision Stand pivot transfers: Modified independent (Device/Increase time);Supervision       General transfer comment: good return for sit to stand with minimal verbal cueing  Ambulation/Gait Ambulation/Gait assistance: Supervision Gait Distance (Feet):  150 Feet Assistive device: Rolling walker (2 wheeled) Gait Pattern/deviations: Decreased step length - right;Decreased stance time - right;Decreased stride length;Antalgic Gait velocity: decreased   General Gait Details: slightly labored cadence with fair return for right heel to toe stepping, no loss of balance, limited secondary to fatigue and increasing right knee paind  Stairs Stairs: Yes Stairs assistance: Supervision Stair Management: One rail Right;One rail Left;Step to pattern Number of Stairs: 4 General stair comments: demonstrates good return for going up/down stairs using 1 side rail with step to pattern without loss of balance  Wheelchair Mobility    Modified Rankin (Stroke Patients Only)       Balance Overall balance assessment: Needs assistance Sitting-balance support: Feet supported;No upper extremity supported Sitting balance-Leahy Scale: Good Sitting balance - Comments: seated at EOB   Standing balance support: During functional activity;Bilateral upper extremity supported Standing balance-Leahy Scale: Fair Standing balance comment: fair/good using RW                             Pertinent Vitals/Pain Pain Assessment: 0-10 Pain Score: 5  Pain Location: right knee Pain Descriptors / Indicators: Sore Pain Intervention(s): Limited activity within patient's tolerance;Monitored during session;Repositioned    Home Living Family/patient expects to be discharged to:: Private residence Living Arrangements: Spouse/significant other Available Help at Discharge: Family;Available 24 hours/day Type of Home: House Home Access: Stairs to enter Entrance Stairs-Rails: None Entrance Stairs-Number of Steps: 1 Home Layout: One level Home Equipment: Walker - 2 wheels;Cane - single point      Prior Function Level of Independence: Independent with assistive device(s)  Comments: [employer], drives     Hand Dominance    Dominant Hand: Right    Extremity/Trunk Assessment   Upper Extremity Assessment Upper Extremity Assessment: Overall WFL for tasks assessed    Lower Extremity Assessment Lower Extremity Assessment: Overall WFL for tasks assessed;RLE deficits/detail RLE Deficits / Details: grossly 4/5 RLE Sensation: WNL RLE Coordination: WNL    Cervical / Trunk Assessment Cervical / Trunk Assessment: Normal  Communication   Communication: No difficulties  Cognition Arousal/Alertness: Awake/alert Behavior During Therapy: WFL for tasks assessed/performed Overall Cognitive Status: Within Functional Limits for tasks assessed                                        General Comments      Exercises Total Joint Exercises Ankle Circles/Pumps: Supine;10 reps;Right;Strengthening;AROM Quad Sets: Supine;10 reps;Right;Strengthening;AROM Short Arc Quad: Supine;10 reps;Right;Strengthening;AROM Heel Slides: Supine;10 reps;Strengthening;AROM;Right Goniometric ROM: 0 - 110 DEGREES   Assessment/Plan    PT Assessment Patient needs continued PT services  PT Problem List Decreased strength;Decreased range of motion;Decreased activity tolerance;Decreased balance;Decreased mobility       PT Treatment Interventions DME instruction;Gait training;Stair training;Functional mobility training;Therapeutic activities;Balance training;Patient/family education    PT Goals (Current goals can be found in the Care Plan section)  Acute Rehab PT Goals Patient Stated Goal: return home with family to assist PT Goal Formulation: With patient Time For Goal Achievement: 10/11/20 Potential to Achieve Goals: Good    Frequency BID   Barriers to discharge        Co-evaluation               AM-PAC PT "6 Clicks" Mobility  Outcome Measure Help needed turning from your back to your side while in a flat bed without using bedrails?: None Help needed moving from lying on your back to sitting on the side of  a flat bed without using bedrails?: None Help needed moving to and from a bed to a chair (including a wheelchair)?: A Little Help needed standing up from a chair using your arms (e.g., wheelchair or bedside chair)?: A Little Help needed to walk in hospital room?: A Little Help needed climbing 3-5 steps with a railing? : A Little 6 Click Score: 20    End of Session   Activity Tolerance: Patient tolerated treatment well;Patient limited by fatigue;Patient limited by pain Patient left: in bed;with call bell/phone within reach Nurse Communication: Mobility status PT Visit Diagnosis: Unsteadiness on feet (R26.81);Other abnormalities of gait and mobility (R26.89);Muscle weakness (generalized) (M62.81)    Time: 6222-9798 PT Time Calculation (min) (ACUTE ONLY): 30 min   Charges:   PT Evaluation $PT Eval Moderate Complexity: 1 Mod PT Treatments $Therapeutic Activity: 23-37 mins        2:25 PM, 10/09/20 [name], MPT Physical Therapist with [facility] [phone] office [phone] mobile phone

## 2020-10-09 NOTE — Anesthesia Preprocedure Evaluation (Signed)
Anesthesia Evaluation  Patient identified by MRN, date of birth, ID band Patient awake    Reviewed: Allergy & Precautions, H&P , NPO status , Patient's Chart, lab work & pertinent test results, reviewed documented beta blocker date and time   History of Anesthesia Complications (+) history of anesthetic complications  Airway Mallampati: II  TM Distance: >3 FB Neck ROM: full    Dental no notable dental hx.    Pulmonary asthma , sleep apnea ,    Pulmonary exam normal breath sounds clear to auscultation       Cardiovascular Exercise Tolerance: Good hypertension, negative cardio ROS   Rhythm:regular Rate:Normal     Neuro/Psych Anxiety negative neurological ROS  negative psych ROS   GI/Hepatic Neg liver ROS, GERD  Medicated,  Endo/Other  negative endocrine ROSdiabetes  Renal/GU negative Renal ROS  negative genitourinary   Musculoskeletal   Abdominal   Peds  Hematology negative hematology ROS (+)   Anesthesia Other Findings   Reproductive/Obstetrics negative OB ROS                             Anesthesia Physical Anesthesia Plan  ASA: III  Anesthesia Plan: Spinal   Post-op Pain Management:  Regional for Post-op pain   Induction:   PONV Risk Score and Plan: TIVA  Airway Management Planned: Natural Airway and Nasal Cannula  Additional Equipment:   Intra-op Plan:   Post-operative Plan:   Informed Consent: I have reviewed the patients History and Physical, chart, labs and discussed the procedure including the risks, benefits and alternatives for the proposed anesthesia with the patient or authorized representative who has indicated his/her understanding and acceptance.     Dental Advisory Given  Plan Discussed with: CRNA  Anesthesia Plan Comments:         Anesthesia Quick Evaluation

## 2020-10-09 NOTE — H&P (Signed)
Luis Ford is an 60 y.o. male.   Chief Complaint: right knee pain  HPI: 60 yo male disabling right knee pain unresponsive to non operative treatment, desires right total knee , he had become very debilitated   Past Medical History:  Diagnosis Date  . Asthma   . Complication of anesthesia    patient states he can only have spinal anesthesia.    . Complication of anesthesia    Patient stopped breathing after geneal anesthesia.  . Diabetes mellitus without complication (HCC)   . Hyperlipemia   . Hypertension   . MRSA infection   . Sleep apnea   . Toe amputee [facility])     Past Surgical History:  Procedure Laterality Date  . APPENDECTOMY    . COLONOSCOPY  2003   [facility]-normal  . COLONOSCOPY  04/15/2012   Procedure: COLONOSCOPY;  Surgeon: Corbin Ade, MD;  Location: [facility];  Service: Endoscopy;  Laterality: N/A;  8:45  . HERNIA REPAIR     right inguinal  . TOE AMPUTATION    . TRACHEOSTOMY     infant/closure as child  . TRACHEOSTOMY     pt was 20 days old when he had a trach placed.     Family History  Problem Relation Age of Onset  . Coronary artery disease Mother   . Colon cancer Brother 17   Social History:  reports that he has never smoked. He has never used smokeless tobacco. He reports that he does not drink alcohol and does not use drugs.  Allergies:  Allergies  Allergen Reactions  . Penicillins Other (See Comments)    Childhood illness     Medications Prior to Admission  Medication Sig Dispense Refill  . acetaminophen (TYLENOL) 500 MG tablet Take 1,000 mg by mouth every 6 (six) hours as needed for moderate pain.    Marland Kitchen b complex vitamins capsule Take 1 capsule by mouth daily.    . diclofenac (VOLTAREN) 75 MG EC tablet Take 1 tablet (75 mg total) by mouth 2 (two) times daily. 30 tablet 0  . diclofenac Sodium (VOLTAREN) 1 % GEL SMARTSIG:1 Inch(es) Topical 3 Times Daily (Patient taking differently: Apply 2 g topically in the morning and at bedtime.) 350 g 2   . escitalopram (LEXAPRO) 10 MG tablet Take 1 tablet (10 mg total) by mouth daily. 90 tablet 1  . HYDROcodone-acetaminophen (NORCO/VICODIN) 5-325 MG tablet One tablet every four hours as needed for acute pain.  Limit of five days per [place] statue. (Patient taking differently: Take 1 tablet by mouth every 4 (four) hours as needed for moderate pain.) 30 tablet 0  . lisinopril-hydrochlorothiazide (ZESTORETIC) 20-12.5 MG tablet Take 2 tablets by mouth daily. 180 tablet 3  . metFORMIN (GLUCOPHAGE-XR) 500 MG 24 hr tablet Take 500 mg by mouth daily before supper.    Marland Kitchen omeprazole (PRILOSEC) 20 MG capsule Take 1 capsule (20 mg total) by mouth daily. 90 capsule 3  . rosuvastatin (CRESTOR) 40 MG tablet Take 1 tablet (40 mg total) by mouth daily. 90 tablet 3  . Cholecalciferol (VITAMIN D3) 125 MCG (5000 UT) CAPS TAKE ONE CAPSULE BY MOUTH DAILY. (Patient not taking: Reported on 09/27/2020) 100 capsule 1  . sildenafil (REVATIO) 20 MG tablet Take 1-3 tablets (20-60 mg total) by mouth as needed. 20 tablet 1    Results for orders placed or performed during the hospital encounter of 10/09/20 (from the past 48 hour(s))  Glucose, capillary     Status: Abnormal  Collection Time: 10/09/20  7:09 AM  Result Value Ref Range   Glucose-Capillary 120 (H) 70 - 99 mg/dL    Comment: Glucose reference range applies only to samples taken after fasting for at least 8 hours.   No results found.  Review of Systems  Pulse 81, temperature 98.5 F (36.9 C), temperature source Oral, resp. rate 14, height 6\' 6"  (1.981 m), weight 110 kg. Physical Exam Constitutional:      General: He is not in acute distress.    Appearance: He is well-developed. He is not diaphoretic.  HENT:     Head: Normocephalic and atraumatic.     Right Ear: External ear normal.     Left Ear: External ear normal.  Eyes:     General: No scleral icterus.       Right eye: No discharge.        Left eye: No discharge.     Conjunctiva/sclera:  Conjunctivae normal.     Pupils: Pupils are equal, round, and reactive to light.  Neck:     Thyroid: No thyromegaly.     Vascular: No JVD.     Trachea: No tracheal deviation.  Cardiovascular:     Rate and Rhythm: Normal rate and regular rhythm.  Pulmonary:     Effort: Pulmonary effort is normal. No respiratory distress.     Breath sounds: Normal breath sounds. No stridor. No wheezing.  Chest:     Chest wall: No tenderness.  Abdominal:     General: Bowel sounds are normal. There is no distension.     Palpations: Abdomen is soft. There is no mass.     Tenderness: There is no abdominal tenderness.  Musculoskeletal:     Cervical back: Normal range of motion and neck supple.     Comments: Right knee   Skin normal  Tenderness over the medial joint line  Range of motion is limited  Muscle tone is normal   Lymphadenopathy:     Cervical: No cervical adenopathy.  Skin:    General: Skin is warm and dry.     Coloration: Skin is not pale.     Findings: No erythema or rash.  Neurological:     Mental Status: He is alert and oriented to person, place, and time.     Cranial Nerves: No cranial nerve deficit.     Motor: No abnormal muscle tone.     Coordination: Coordination normal.     Deep Tendon Reflexes: Reflexes are normal and symmetric. Reflexes normal.  Psychiatric:        Behavior: Behavior normal.        Thought Content: Thought content normal.        Judgment: Judgment normal.      Assessment/Plan  xrays - varus less than 10, medial side wear severe secondary bone changes present   Right knee osteoarthritis  The procedure has been fully reviewed with the patient; The risks and benefits of surgery have been discussed and explained and understood. Alternative treatment has also been reviewed, questions were encouraged and answered. The postoperative plan is also been reviewed.  Right total knee   , MD 10/09/2020, 7:26 AM

## 2020-10-09 NOTE — Transfer of Care (Signed)
Immediate Anesthesia Transfer of Care Note  Patient: Luis Ford  Procedure(s) Performed: RIGHT TOTAL KNEE ARTHROPLASTY (Right Knee)  Patient Location: PACU  Anesthesia Type:Spinal  Level of Consciousness: awake, alert  and patient cooperative  Airway & Oxygen Therapy: Patient Spontanous Breathing  Post-op Assessment: Report given to RN and Post -op Vital signs reviewed and stable  Post vital signs: Reviewed and stable  Last Vitals:  Vitals Value Taken Time  BP 79/51 10/09/20 1000  Temp 97.8   Pulse 71 10/09/20 1002  Resp 12 10/09/20 1002  SpO2 97 % 10/09/20 1002  Vitals shown include unvalidated device data. Tv10 level Last Pain:  Vitals:   10/09/20 0643  TempSrc: Oral  PainSc: 0-No pain      Patients Stated Pain Goal: 9 (10/09/20 8257)  Complications: No complications documented.

## 2020-10-09 NOTE — Interval H&P Note (Signed)
History and Physical Interval Note:  10/09/2020 7:30 AM  Luis Ford  has presented today for surgery, with the diagnosis of Right knee osteoarthritis.  The various methods of treatment have been discussed with the patient and family. After consideration of risks, benefits and other options for treatment, the patient has consented to  Procedure(s): RIGHT TOTAL KNEE ARTHROPLASTY (Right) as a surgical intervention.  The patient's history has been reviewed, patient examined, no change in status, stable for surgery.  I have reviewed the patient's chart and labs.  Questions were answered to the patient's satisfaction.     Fuller Canada

## 2020-10-10 ENCOUNTER — Encounter (HOSPITAL_COMMUNITY): Payer: Self-pay | Admitting: Orthopedic Surgery

## 2020-10-10 ENCOUNTER — Telehealth: Payer: Self-pay | Admitting: Orthopedic Surgery

## 2020-10-10 DIAGNOSIS — E785 Hyperlipidemia, unspecified: Secondary | ICD-10-CM | POA: Diagnosis not present

## 2020-10-10 DIAGNOSIS — G4730 Sleep apnea, unspecified: Secondary | ICD-10-CM | POA: Diagnosis not present

## 2020-10-10 DIAGNOSIS — J45909 Unspecified asthma, uncomplicated: Secondary | ICD-10-CM | POA: Diagnosis not present

## 2020-10-10 DIAGNOSIS — I10 Essential (primary) hypertension: Secondary | ICD-10-CM | POA: Diagnosis not present

## 2020-10-10 DIAGNOSIS — M1711 Unilateral primary osteoarthritis, right knee: Secondary | ICD-10-CM

## 2020-10-10 DIAGNOSIS — Z79899 Other long term (current) drug therapy: Secondary | ICD-10-CM | POA: Diagnosis not present

## 2020-10-10 DIAGNOSIS — Z89429 Acquired absence of other toe(s), unspecified side: Secondary | ICD-10-CM | POA: Diagnosis not present

## 2020-10-10 DIAGNOSIS — M25561 Pain in right knee: Secondary | ICD-10-CM | POA: Diagnosis not present

## 2020-10-10 DIAGNOSIS — Z96651 Presence of right artificial knee joint: Secondary | ICD-10-CM | POA: Diagnosis not present

## 2020-10-10 DIAGNOSIS — E119 Type 2 diabetes mellitus without complications: Secondary | ICD-10-CM | POA: Diagnosis not present

## 2020-10-10 DIAGNOSIS — Z791 Long term (current) use of non-steroidal anti-inflammatories (NSAID): Secondary | ICD-10-CM | POA: Diagnosis not present

## 2020-10-10 LAB — BASIC METABOLIC PANEL
Anion gap: 9 (ref 5–15)
BUN: 20 mg/dL (ref 6–20)
CO2: 27 mmol/L (ref 22–32)
Calcium: 8.9 mg/dL (ref 8.9–10.3)
Chloride: 98 mmol/L (ref 98–111)
Creatinine, Ser: 1.37 mg/dL — ABNORMAL HIGH (ref 0.61–1.24)
GFR, Estimated: 59 mL/min — ABNORMAL LOW (ref >60)
Glucose, Bld: 110 mg/dL — ABNORMAL HIGH (ref 70–99)
Potassium: 4 mmol/L (ref 3.5–5.1)
Sodium: 134 mmol/L — ABNORMAL LOW (ref 135–145)

## 2020-10-10 LAB — CBC
HCT: 40.5 % (ref 39.0–52.0)
Hemoglobin: 12.7 g/dL — ABNORMAL LOW (ref 13.0–17.0)
MCH: 29.5 pg (ref 26.0–34.0)
MCHC: 31.4 g/dL (ref 30.0–36.0)
MCV: 94 fL (ref 80.0–100.0)
Platelets: 263 10*3/uL (ref 150–400)
RBC: 4.31 MIL/uL (ref 4.22–5.81)
RDW: 13.8 % (ref 11.5–15.5)
WBC: 6.1 10*3/uL (ref 4.0–10.5)
nRBC: 0 % (ref 0.0–0.2)

## 2020-10-10 LAB — GLUCOSE, CAPILLARY
Glucose-Capillary: 109 mg/dL — ABNORMAL HIGH (ref 70–99)
Glucose-Capillary: 122 mg/dL — ABNORMAL HIGH (ref 70–99)

## 2020-10-10 MED ORDER — ASPIRIN 81 MG PO CHEW: 81.0000 mg | CHEWABLE_TABLET | Freq: Two times a day (BID) | ORAL | 0 refills | Status: DC

## 2020-10-10 MED ORDER — TRAMADOL HCL 50 MG PO TABS: 50.0000 mg | ORAL_TABLET | Freq: Four times a day (QID) | ORAL | 0 refills | Status: DC

## 2020-10-10 MED ORDER — DOCUSATE SODIUM 100 MG PO CAPS: 100.0000 mg | ORAL_CAPSULE | Freq: Two times a day (BID) | ORAL | 0 refills | Status: DC

## 2020-10-10 MED ORDER — METHOCARBAMOL 500 MG PO TABS
500.0000 mg | ORAL_TABLET | Freq: Four times a day (QID) | ORAL | 1 refills | Status: AC | PRN
Start: 2020-10-10 — End: 2020-10-24

## 2020-10-10 MED ORDER — CELECOXIB 200 MG PO CAPS: 200.0000 mg | ORAL_CAPSULE | Freq: Two times a day (BID) | ORAL | 0 refills | Status: DC

## 2020-10-10 MED ORDER — OXYCODONE-ACETAMINOPHEN 5-325 MG PO TABS: 1.0000 | ORAL_TABLET | ORAL | 0 refills | Status: AC | PRN

## 2020-10-10 NOTE — Discharge Summary (Signed)
Physician Discharge Summary  Patient ID: Luis Ford MRN: [id number] DOB/AGE: Dec 18, 1960 60 y.o.  Admit date: 10/09/2020 Discharge date: 10/10/2020  Admission Diagnoses: primary osteoarthritis right knee   Discharge Diagnoses: primary osteoarthritis right knee  Active Problems:   Primary osteoarthritis of right knee   Osteoarthritis of right knee   Discharged Condition: good  Hospital Course: Surgery was on March 22 uncomplicated right total knee with DePuy attune fixed-bearing posterior stabilized knee system.  Patient walked well with therapy had 100 degrees of knee flexion.  Postop day 1 he complained of soreness was in stable condition normal laboratory studies and he was neurovascular intact and stable for discharge   CBC Latest Ref Rng & Units 10/10/2020 10/04/2020 09/14/2020  WBC 4.0 - 10.5 K/uL 6.1 7.6 7.1  Hemoglobin 13.0 - 17.0 g/dL 12.7(L) 14.3 14.8  Hematocrit 39.0 - 52.0 % 40.5 44.5 45.5  Platelets 150 - 400 K/uL 263 301 284   BMP Latest Ref Rng & Units 10/10/2020 10/04/2020 09/14/2020  Glucose 70 - 99 mg/dL 409(W) 90 119(J)  BUN 6 - 20 mg/dL 20 19 18   Creatinine 0.61 - 1.24 mg/dL ) 4.78(G) 9.56(O)  BUN/Creat Ratio 9 - 20 - - 12  Sodium 135 - 145 mmol/L 134(L) 136 139  Potassium 3.5 - 5.1 mmol/L 4.0 4.0 5.1  Chloride 98 - 111 mmol/L 98 101 98  CO2 22 - 32 mmol/L 27 24 24   Calcium 8.9 - 10.3 mg/dL 8.9 9.6 1.30(Q    Discharge Exam: Blood pressure 125/80, pulse 67, temperature 97.6 F (36.4 C), temperature source Oral, resp. rate 20, height 6\' 6"  (1.981 m), weight 110 kg, SpO2 98 %.   Disposition: Home with home therapy   Allergies as of 10/10/2020      Reactions   Penicillins Other (See Comments)   Childhood illness       Medication List    STOP taking these medications   acetaminophen 500 MG tablet Commonly known as: TYLENOL   diclofenac 75 MG EC tablet Commonly known as: VOLTAREN   HYDROcodone-acetaminophen 5-325 MG tablet Commonly known as:  NORCO/VICODIN   Vitamin D3 125 MCG (5000 UT) Caps     TAKE these medications   aspirin 81 MG chewable tablet Chew 1 tablet (81 mg total) by mouth 2 (two) times daily.   b complex vitamins capsule Take 1 capsule by mouth daily.   celecoxib 200 MG capsule Commonly known as: CELEBREX Take 1 capsule (200 mg total) by mouth 2 (two) times daily.   diclofenac Sodium 1 % Gel Commonly known as: VOLTAREN SMARTSIG:1 Inch(es) Topical 3 Times Daily What changed:   how much to take  how to take this  when to take this  additional instructions   docusate sodium 100 MG capsule Commonly known as: COLACE Take 1 capsule (100 mg total) by mouth 2 (two) times daily.   escitalopram 10 MG tablet Commonly known as: LEXAPRO Take 1 tablet (10 mg total) by mouth daily.   lisinopril-hydrochlorothiazide 20-12.5 MG tablet Commonly known as: ZESTORETIC Take 2 tablets by mouth daily.   metFORMIN 500 MG 24 hr tablet Commonly known as: GLUCOPHAGE-XR Take 500 mg by mouth daily before supper.   methocarbamol 500 MG tablet Commonly known as: ROBAXIN Take 1 tablet (500 mg total) by mouth every 6 (six) hours as needed for up to 14 days for muscle spasms.   omeprazole 20 MG capsule Commonly known as: PRILOSEC Take 1 capsule (20 mg total) by mouth daily.   oxyCODONE-acetaminophen  5-325 MG tablet Commonly known as: PERCOCET/ROXICET Take 1 tablet by mouth every 4 (four) hours as needed for up to 7 days for severe pain.   rosuvastatin 40 MG tablet Commonly known as: Crestor Take 1 tablet (40 mg total) by mouth daily.   sildenafil 20 MG tablet Commonly known as: REVATIO Take 1-3 tablets (20-60 mg total) by mouth as needed.   traMADol 50 MG tablet Commonly known as: ULTRAM Take 1 tablet (50 mg total) by mouth every 6 (six) hours.        Signed: Fuller Canada 10/10/2020, 8:23 AM

## 2020-10-10 NOTE — Progress Notes (Signed)
Physical Therapy Treatment Patient Details Name: Luis Ford MRN: [id number] DOB: Nov 30, 1960 Today's Date: 10/10/2020  RIGHT KNEE ROM:  0 - 110 degrees AMBULATION DISTANCE: 200 feet using RW with Modified Independent/Supervision   History of Present Illness Luis Ford is a 60 y/o male, s/p Right TKA on 09/11/20  with the diagnosis of Right knee osteoarthritis.    PT Comments    Patient demonstrates increased endurance/distance for gait training with fair/good return for right heel to toe stepping without loss of balance, required verbal cues for proper hand placement during sit to stands with good carryover and tolerated sitting up in chair with RLE dangling after therapy.  Patient will benefit from continued physical therapy in hospital and recommended venue below to increase strength, balance, endurance for safe ADLs and gait.    Follow Up Recommendations  Home health PT;Supervision for mobility/OOB;Supervision - Intermittent     Equipment Recommendations  3in1 (PT) (raised toilet seat or 3 in 1 to put over commode at home)    Recommendations for Other Services       Precautions / Restrictions Precautions Precautions: Fall Restrictions Weight Bearing Restrictions: Yes RLE Weight Bearing: Weight bearing as tolerated    Mobility  Bed Mobility Overal bed mobility: Modified Independent                  Transfers Overall transfer level: Needs assistance Equipment used: Rolling walker (2 wheeled)   Sit to Stand: Modified independent (Device/Increase time);Supervision Stand pivot transfers: Modified independent (Device/Increase time);Supervision       General transfer comment: requires verbal cues for proper hand placement during sit to stands with good carryover demonstrated  Ambulation/Gait Ambulation/Gait assistance: Modified independent (Device/Increase time);Supervision Gait Distance (Feet): 200 Feet Assistive device: Rolling walker (2 wheeled) Gait  Pattern/deviations: Decreased step length - right;Decreased stance time - right;Decreased stride length;Antalgic Gait velocity: decreased   General Gait Details: increased endurance/distance for ambulation with fair/good return for right heel to toe stepping after verbal cues without loss of balance   Stairs             Wheelchair Mobility    Modified Rankin (Stroke Patients Only)       Balance Overall balance assessment: Needs assistance Sitting-balance support: Feet supported;No upper extremity supported Sitting balance-Leahy Scale: Good Sitting balance - Comments: seated at EOB   Standing balance support: During functional activity;Bilateral upper extremity supported Standing balance-Leahy Scale: Fair Standing balance comment: fair/good using RW                            Cognition Arousal/Alertness: Awake/alert Behavior During Therapy: WFL for tasks assessed/performed Overall Cognitive Status: Within Functional Limits for tasks assessed                                        Exercises Total Joint Exercises Ankle Circles/Pumps: Supine;Right;Strengthening;AROM;15 reps Quad Sets: Supine;Right;Strengthening;AROM;15 reps Short Arc Quad: Supine;Right;Strengthening;AROM;15 reps Heel Slides: Supine;10 reps;Strengthening;AROM;Right Goniometric ROM: Right knee: 0 - 110 degrees    General Comments        Pertinent Vitals/Pain Pain Assessment: Faces Pain Score: 5  Pain Location: right knee Pain Descriptors / Indicators: Sore Pain Intervention(s): Limited activity within patient's tolerance;Monitored during session;RN gave pain meds during session    Home Living  Prior Function            PT Goals (current goals can now be found in the care plan section) Acute Rehab PT Goals Patient Stated Goal: return home with family to assist PT Goal Formulation: With patient Time For Goal Achievement:  10/11/20 Potential to Achieve Goals: Good Progress towards PT goals: Progressing toward goals    Frequency    BID      PT Plan Current plan remains appropriate    Co-evaluation              AM-PAC PT "6 Clicks" Mobility   Outcome Measure  Help needed turning from your back to your side while in a flat bed without using bedrails?: None Help needed moving from lying on your back to sitting on the side of a flat bed without using bedrails?: None Help needed moving to and from a bed to a chair (including a wheelchair)?: None Help needed standing up from a chair using your arms (e.g., wheelchair or bedside chair)?: None Help needed to walk in hospital room?: A Little Help needed climbing 3-5 steps with a railing? : A Little 6 Click Score: 22    End of Session   Activity Tolerance: Patient tolerated treatment well;Patient limited by fatigue Patient left: in chair;with call bell/phone within reach Nurse Communication: Mobility status PT Visit Diagnosis: Unsteadiness on feet (R26.81);Other abnormalities of gait and mobility (R26.89);Muscle weakness (generalized) (M62.81)     Time: 6237-6283 PT Time Calculation (min) (ACUTE ONLY): 28 min  Charges:  $Gait Training: 8-22 mins $Therapeutic Exercise: 8-22 mins                     10:03 AM, 10/10/20 [name], MPT Physical Therapist with [facility] 336 [phone] office [phone] mobile phone

## 2020-10-10 NOTE — Telephone Encounter (Signed)
DME Orders requested per hospital discharge planner; states physical therapy relays patient needs 3 in 1 bedside commode. Request for Dr Romeo Apple to please enter orders.

## 2020-10-10 NOTE — Plan of Care (Signed)

## 2020-10-10 NOTE — TOC Progression Note (Addendum)
TOC spoke with Clifton Custard from [facility] and they already have referral and orders for [facility] for pt. Updated Clifton Custard on dc today. No other TOC needs for dc.  3in1 arranged through [employer] at pt request.

## 2020-10-11 DIAGNOSIS — E119 Type 2 diabetes mellitus without complications: Secondary | ICD-10-CM | POA: Diagnosis not present

## 2020-10-11 DIAGNOSIS — N529 Male erectile dysfunction, unspecified: Secondary | ICD-10-CM | POA: Diagnosis not present

## 2020-10-11 DIAGNOSIS — E785 Hyperlipidemia, unspecified: Secondary | ICD-10-CM | POA: Diagnosis not present

## 2020-10-11 DIAGNOSIS — Z96651 Presence of right artificial knee joint: Secondary | ICD-10-CM | POA: Diagnosis not present

## 2020-10-11 DIAGNOSIS — Z471 Aftercare following joint replacement surgery: Secondary | ICD-10-CM | POA: Diagnosis not present

## 2020-10-11 DIAGNOSIS — K219 Gastro-esophageal reflux disease without esophagitis: Secondary | ICD-10-CM | POA: Diagnosis not present

## 2020-10-11 DIAGNOSIS — Z7982 Long term (current) use of aspirin: Secondary | ICD-10-CM | POA: Diagnosis not present

## 2020-10-11 DIAGNOSIS — Z7984 Long term (current) use of oral hypoglycemic drugs: Secondary | ICD-10-CM | POA: Diagnosis not present

## 2020-10-11 DIAGNOSIS — F419 Anxiety disorder, unspecified: Secondary | ICD-10-CM | POA: Diagnosis not present

## 2020-10-11 DIAGNOSIS — I10 Essential (primary) hypertension: Secondary | ICD-10-CM | POA: Diagnosis not present

## 2020-10-11 DIAGNOSIS — Z791 Long term (current) use of non-steroidal anti-inflammatories (NSAID): Secondary | ICD-10-CM | POA: Diagnosis not present

## 2020-10-11 DIAGNOSIS — Z79891 Long term (current) use of opiate analgesic: Secondary | ICD-10-CM | POA: Diagnosis not present

## 2020-10-11 NOTE — Telephone Encounter (Signed)
Put order in

## 2020-10-11 NOTE — Op Note (Signed)
10/09/2020   10:16 AM   PATIENT:  Luis Ford  61 y.o. male   PRE-OPERATIVE DIAGNOSIS:  Right knee osteoarthritis   POST-OPERATIVE DIAGNOSIS:  Right knee osteoarthritis   PROCEDURE:  Procedure(s): RIGHT TOTAL KNEE ARTHROPLASTY (Right)    DEPUY PS FB ATTUNE  69F  7 T 5 POLY 38 P   BONE CUTS  9 F  3 TIBIA MEDIAL SIDE PATELLA 33 TO 13    FINDINGS    Preop full extension was noted he had a varus deformity less than 10 degrees it was passively correctable    Intra-Op he had severe wear of the medial side medial.  We noted grade 4 wear on the tibia and femur and trochlea grade 2 on the patella lateral side was intact   He had intact medial lateral meniscus as well as an intact ACL and PCL    SURGEON:  Surgeon(s) and Role:    * Vickki Hearing, MD - Primary   PHYSICIAN ASSISTANT:    ASSISTANTS: DEBBIE DALLAS    ANESTHESIA:   spinal THEN IN PACU ADDUCTOR CANAL BLOCK    EBL:  10 mL    BLOOD ADMINISTERED:none   DRAINS: none    LOCAL MEDICATIONS USED:  OTHER    SPECIMEN:  No Specimen   DISPOSITION OF SPECIMEN:  N/A   COUNTS:  YES   TOURNIQUET:   Total Tourniquet Time Documented: Thigh (Right) - 92 minutes Total: Thigh (Right) - 92 minutes     DICTATION: .Reubin Milan Dictation   PLAN OF CARE: Admit to inpatient    PATIENT DISPOSITION:  PACU - hemodynamically stable.   Delay start of Pharmacological VTE agent (>24hrs) due to surgical blood loss or risk of bleeding: yes   Dictation of the procedure   Details of surgery: The patient was identified by 2 approved identification mechanisms. The operative extremity was evaluated and found to be acceptable for surgical treatment today. The chart was reviewed. The surgical site was confirmed and marked.     The patient was taken to the operating room and given appropriate antibiotic vancomycin secondary to penicillin allergy. This is consistent with the SCIP protocol.   The patient was given the following  anesthetic: Spinal   The patient was then placed supine on the operating table. A Foley catheter was inserted. The operative extremity was prepped and draped sterilely from the toes to the groin.   Timeout was executed confirming the patient's name, surgical site, antibiotic administration, x-rays available, and implants available.   The operative limb,  was exsanguinated with a six-inch Esmarch and the tourniquet was inflated to 280 mmHg.   A straight midline incision was made over the right KNEE and taken down to the extensor mechanism. A medial arthrotomy was performed. The patella was everted and the patellofemoral soft tissue was released, along with the patellar fat pad.  The anterior cruciate ligament and PCL were resected.   The anterior horns of the lateral and medial meniscus were resected. The medial soft tissue sleeve was elevated to the mid coronal plane.   A three-eighths inch drill bit was used to enter the femoral canal which was decompressed with suction and irrigation until clear.    The distal femoral cutting guide was set for 9 mm distal resection,  5valgus alignment, for a right knee. The distal femur was resected and checked for flatness.   The Depuy Sigma sizing femoral guide was placed and the femur was sized to a size 6.  The external alignment guide for the tibial resection was then applied to the distal and proximal tibia and set for anatomic slope along with 3 MM resection  from the  MEDIAL  .    Rotational alignment was set using the malleolus, the tibial tubercle and the tibial spines.   The proximal tibia was resected along with  residual menisci. The tibia was sized using a base plate to a size  7 .    The extension gap was checked.  Balance was obtained with a 5 mm spacer block in full extension   We then placed the 4-in-1 cutting block and use the same 5 mm spacer to check the flexion gap cut it looked very good and we proceeded.    with collateral  ligament retractors and the distal femoral cuts were completed.   Spacer blocks were used to confirm equal flexion extension gaps with releases done as needed. A size 5 MM spacer block gave equal stability and flexion extension.   The correct sized notch cutting guide for the femur was then applied and the notch cut was made.   Trial reduction was completed using size 6 femur, 7 tibia, 5 mm insert trial implants. Patella tracking was normal   We then skeletonized the patella. It measured 23 in thickness and the patellar resection was set for 13 millimeters. the patellar resection was completed. The patella diameter measured 38. We then drilled the peg holes for the patella.  Completed patellar thickness was 23   The proximal tibia was prepared using the size 7 base plate.   Thorough irrigation was performed and the bone was dried and prepared for cement. The cement was mixed on the back table using third generation preparation techniques     The implants were then cemented in place and excess cement was removed. The cement was allowed to cure. Irrigation was repeated and excess and residual bone fragments and cement were removed.  The soft tissues were completely dried and then the ZYNRELEF was placed in the posterior part of the knee followed by insert placement and then more medication in the gutters     The extensor mechanism was closed with #1 Bralon suture followed by subcutaneous tissue closure using 0 Monocryl suture     Skin approximation was performed using staples   A sterile dressing was applied, followed by a ace wrapped from foot to thigh and then a Cryo/Cuff which was activated    The patient was taken recovery room in stable condition where anesthesia would provide a abductor canal block

## 2020-10-12 ENCOUNTER — Telehealth: Payer: Self-pay | Admitting: Orthopedic Surgery

## 2020-10-12 NOTE — Telephone Encounter (Signed)
Joey from [facility] needs verbal orders on this patient.  Please call Joey at [phone]  Thanks

## 2020-10-13 DIAGNOSIS — Z79891 Long term (current) use of opiate analgesic: Secondary | ICD-10-CM | POA: Diagnosis not present

## 2020-10-13 DIAGNOSIS — Z96651 Presence of right artificial knee joint: Secondary | ICD-10-CM | POA: Diagnosis not present

## 2020-10-13 DIAGNOSIS — F419 Anxiety disorder, unspecified: Secondary | ICD-10-CM | POA: Diagnosis not present

## 2020-10-13 DIAGNOSIS — E785 Hyperlipidemia, unspecified: Secondary | ICD-10-CM | POA: Diagnosis not present

## 2020-10-13 DIAGNOSIS — I10 Essential (primary) hypertension: Secondary | ICD-10-CM | POA: Diagnosis not present

## 2020-10-13 DIAGNOSIS — Z791 Long term (current) use of non-steroidal anti-inflammatories (NSAID): Secondary | ICD-10-CM | POA: Diagnosis not present

## 2020-10-13 DIAGNOSIS — E119 Type 2 diabetes mellitus without complications: Secondary | ICD-10-CM | POA: Diagnosis not present

## 2020-10-13 DIAGNOSIS — K219 Gastro-esophageal reflux disease without esophagitis: Secondary | ICD-10-CM | POA: Diagnosis not present

## 2020-10-13 DIAGNOSIS — N529 Male erectile dysfunction, unspecified: Secondary | ICD-10-CM | POA: Diagnosis not present

## 2020-10-13 DIAGNOSIS — Z7984 Long term (current) use of oral hypoglycemic drugs: Secondary | ICD-10-CM | POA: Diagnosis not present

## 2020-10-13 DIAGNOSIS — Z7982 Long term (current) use of aspirin: Secondary | ICD-10-CM | POA: Diagnosis not present

## 2020-10-13 DIAGNOSIS — Z471 Aftercare following joint replacement surgery: Secondary | ICD-10-CM | POA: Diagnosis not present

## 2020-10-15 DIAGNOSIS — E119 Type 2 diabetes mellitus without complications: Secondary | ICD-10-CM | POA: Diagnosis not present

## 2020-10-15 DIAGNOSIS — F419 Anxiety disorder, unspecified: Secondary | ICD-10-CM | POA: Diagnosis not present

## 2020-10-15 DIAGNOSIS — Z471 Aftercare following joint replacement surgery: Secondary | ICD-10-CM | POA: Diagnosis not present

## 2020-10-15 DIAGNOSIS — Z791 Long term (current) use of non-steroidal anti-inflammatories (NSAID): Secondary | ICD-10-CM | POA: Diagnosis not present

## 2020-10-15 DIAGNOSIS — Z79891 Long term (current) use of opiate analgesic: Secondary | ICD-10-CM | POA: Diagnosis not present

## 2020-10-15 DIAGNOSIS — Z7982 Long term (current) use of aspirin: Secondary | ICD-10-CM | POA: Diagnosis not present

## 2020-10-15 DIAGNOSIS — Z7984 Long term (current) use of oral hypoglycemic drugs: Secondary | ICD-10-CM | POA: Diagnosis not present

## 2020-10-15 DIAGNOSIS — K219 Gastro-esophageal reflux disease without esophagitis: Secondary | ICD-10-CM | POA: Diagnosis not present

## 2020-10-15 DIAGNOSIS — E785 Hyperlipidemia, unspecified: Secondary | ICD-10-CM | POA: Diagnosis not present

## 2020-10-15 DIAGNOSIS — N529 Male erectile dysfunction, unspecified: Secondary | ICD-10-CM | POA: Diagnosis not present

## 2020-10-15 DIAGNOSIS — I10 Essential (primary) hypertension: Secondary | ICD-10-CM | POA: Diagnosis not present

## 2020-10-15 DIAGNOSIS — Z96651 Presence of right artificial knee joint: Secondary | ICD-10-CM | POA: Diagnosis not present

## 2020-10-15 NOTE — Telephone Encounter (Signed)
Called with the verbals  Did not get frequency of visits.

## 2020-10-17 ENCOUNTER — Encounter: Payer: Self-pay | Admitting: Orthopedic Surgery

## 2020-10-17 ENCOUNTER — Other Ambulatory Visit: Payer: Self-pay

## 2020-10-17 ENCOUNTER — Ambulatory Visit (INDEPENDENT_AMBULATORY_CARE_PROVIDER_SITE_OTHER): Payer: BC Managed Care – PPO | Admitting: Orthopedic Surgery

## 2020-10-17 DIAGNOSIS — K219 Gastro-esophageal reflux disease without esophagitis: Secondary | ICD-10-CM | POA: Diagnosis not present

## 2020-10-17 DIAGNOSIS — Z791 Long term (current) use of non-steroidal anti-inflammatories (NSAID): Secondary | ICD-10-CM | POA: Diagnosis not present

## 2020-10-17 DIAGNOSIS — Z96651 Presence of right artificial knee joint: Secondary | ICD-10-CM | POA: Diagnosis not present

## 2020-10-17 DIAGNOSIS — Z79891 Long term (current) use of opiate analgesic: Secondary | ICD-10-CM | POA: Diagnosis not present

## 2020-10-17 DIAGNOSIS — Z7984 Long term (current) use of oral hypoglycemic drugs: Secondary | ICD-10-CM | POA: Diagnosis not present

## 2020-10-17 DIAGNOSIS — Z4889 Encounter for other specified surgical aftercare: Secondary | ICD-10-CM

## 2020-10-17 DIAGNOSIS — E119 Type 2 diabetes mellitus without complications: Secondary | ICD-10-CM | POA: Diagnosis not present

## 2020-10-17 DIAGNOSIS — F419 Anxiety disorder, unspecified: Secondary | ICD-10-CM | POA: Diagnosis not present

## 2020-10-17 DIAGNOSIS — I10 Essential (primary) hypertension: Secondary | ICD-10-CM | POA: Diagnosis not present

## 2020-10-17 DIAGNOSIS — N529 Male erectile dysfunction, unspecified: Secondary | ICD-10-CM | POA: Diagnosis not present

## 2020-10-17 DIAGNOSIS — E785 Hyperlipidemia, unspecified: Secondary | ICD-10-CM | POA: Diagnosis not present

## 2020-10-17 DIAGNOSIS — Z7982 Long term (current) use of aspirin: Secondary | ICD-10-CM | POA: Diagnosis not present

## 2020-10-17 DIAGNOSIS — Z471 Aftercare following joint replacement surgery: Secondary | ICD-10-CM | POA: Diagnosis not present

## 2020-10-17 NOTE — Progress Notes (Signed)
Chief Complaint  Patient presents with  . Post-op Problem    Blood dressing post op 10/09/20 total knee replacement    60 year old male postop day 8 last night had some increased bleeding into his dressing.  We had him come in for checkup the dressing is soaked but we do not see any active bleeding.  His suture line looks good as well.  We redressed it and see him on a regular follow-up basis for staple extraction    Current Outpatient Medications:  .  aspirin 81 MG chewable tablet, Chew 1 tablet (81 mg total) by mouth 2 (two) times daily., Disp: 60 tablet, Rfl: 0 .  b complex vitamins capsule, Take 1 capsule by mouth daily., Disp: , Rfl:  .  celecoxib (CELEBREX) 200 MG capsule, Take 1 capsule (200 mg total) by mouth 2 (two) times daily., Disp: 60 capsule, Rfl: 0 .  diclofenac Sodium (VOLTAREN) 1 % GEL, SMARTSIG:1 Inch(es) Topical 3 Times Daily (Patient taking differently: Apply 2 g topically in the morning and at bedtime.), Disp: 350 g, Rfl: 2 .  docusate sodium (COLACE) 100 MG capsule, Take 1 capsule (100 mg total) by mouth 2 (two) times daily., Disp: 10 capsule, Rfl: 0 .  escitalopram (LEXAPRO) 10 MG tablet, Take 1 tablet (10 mg total) by mouth daily., Disp: 90 tablet, Rfl: 1 .  lisinopril-hydrochlorothiazide (ZESTORETIC) 20-12.5 MG tablet, Take 2 tablets by mouth daily., Disp: 180 tablet, Rfl: 3 .  metFORMIN (GLUCOPHAGE-XR) 500 MG 24 hr tablet, Take 500 mg by mouth daily before supper., Disp: , Rfl:  .  methocarbamol (ROBAXIN) 500 MG tablet, Take 1 tablet (500 mg total) by mouth every 6 (six) hours as needed for up to 14 days for muscle spasms., Disp: 60 tablet, Rfl: 1 .  omeprazole (PRILOSEC) 20 MG capsule, Take 1 capsule (20 mg total) by mouth daily., Disp: 90 capsule, Rfl: 3 .  oxyCODONE-acetaminophen (PERCOCET/ROXICET) 5-325 MG tablet, Take 1 tablet by mouth every 4 (four) hours as needed for up to 7 days for severe pain., Disp: 42 tablet, Rfl: 0 .  rosuvastatin (CRESTOR) 40 MG tablet,  Take 1 tablet (40 mg total) by mouth daily., Disp: 90 tablet, Rfl: 3 .  sildenafil (REVATIO) 20 MG tablet, Take 1-3 tablets (20-60 mg total) by mouth as needed., Disp: 20 tablet, Rfl: 1 .  traMADol (ULTRAM) 50 MG tablet, Take 1 tablet (50 mg total) by mouth every 6 (six) hours., Disp: 42 tablet, Rfl: 0

## 2020-10-20 DIAGNOSIS — I10 Essential (primary) hypertension: Secondary | ICD-10-CM | POA: Diagnosis not present

## 2020-10-20 DIAGNOSIS — Z7982 Long term (current) use of aspirin: Secondary | ICD-10-CM | POA: Diagnosis not present

## 2020-10-20 DIAGNOSIS — Z471 Aftercare following joint replacement surgery: Secondary | ICD-10-CM | POA: Diagnosis not present

## 2020-10-20 DIAGNOSIS — Z791 Long term (current) use of non-steroidal anti-inflammatories (NSAID): Secondary | ICD-10-CM | POA: Diagnosis not present

## 2020-10-20 DIAGNOSIS — F419 Anxiety disorder, unspecified: Secondary | ICD-10-CM | POA: Diagnosis not present

## 2020-10-20 DIAGNOSIS — Z79891 Long term (current) use of opiate analgesic: Secondary | ICD-10-CM | POA: Diagnosis not present

## 2020-10-20 DIAGNOSIS — E119 Type 2 diabetes mellitus without complications: Secondary | ICD-10-CM | POA: Diagnosis not present

## 2020-10-20 DIAGNOSIS — E785 Hyperlipidemia, unspecified: Secondary | ICD-10-CM | POA: Diagnosis not present

## 2020-10-20 DIAGNOSIS — Z7984 Long term (current) use of oral hypoglycemic drugs: Secondary | ICD-10-CM | POA: Diagnosis not present

## 2020-10-20 DIAGNOSIS — N529 Male erectile dysfunction, unspecified: Secondary | ICD-10-CM | POA: Diagnosis not present

## 2020-10-20 DIAGNOSIS — Z96651 Presence of right artificial knee joint: Secondary | ICD-10-CM | POA: Diagnosis not present

## 2020-10-20 DIAGNOSIS — K219 Gastro-esophageal reflux disease without esophagitis: Secondary | ICD-10-CM | POA: Diagnosis not present

## 2020-10-22 ENCOUNTER — Ambulatory Visit: Payer: BC Managed Care – PPO | Admitting: Orthopedic Surgery

## 2020-10-22 ENCOUNTER — Telehealth: Payer: Self-pay | Admitting: Orthopedic Surgery

## 2020-10-22 DIAGNOSIS — Z7984 Long term (current) use of oral hypoglycemic drugs: Secondary | ICD-10-CM | POA: Diagnosis not present

## 2020-10-22 DIAGNOSIS — E119 Type 2 diabetes mellitus without complications: Secondary | ICD-10-CM | POA: Diagnosis not present

## 2020-10-22 DIAGNOSIS — Z791 Long term (current) use of non-steroidal anti-inflammatories (NSAID): Secondary | ICD-10-CM | POA: Diagnosis not present

## 2020-10-22 DIAGNOSIS — Z96651 Presence of right artificial knee joint: Secondary | ICD-10-CM | POA: Diagnosis not present

## 2020-10-22 DIAGNOSIS — M1711 Unilateral primary osteoarthritis, right knee: Secondary | ICD-10-CM

## 2020-10-22 DIAGNOSIS — E785 Hyperlipidemia, unspecified: Secondary | ICD-10-CM | POA: Diagnosis not present

## 2020-10-22 DIAGNOSIS — N529 Male erectile dysfunction, unspecified: Secondary | ICD-10-CM | POA: Diagnosis not present

## 2020-10-22 DIAGNOSIS — F419 Anxiety disorder, unspecified: Secondary | ICD-10-CM | POA: Diagnosis not present

## 2020-10-22 DIAGNOSIS — Z79891 Long term (current) use of opiate analgesic: Secondary | ICD-10-CM | POA: Diagnosis not present

## 2020-10-22 DIAGNOSIS — Z471 Aftercare following joint replacement surgery: Secondary | ICD-10-CM | POA: Diagnosis not present

## 2020-10-22 DIAGNOSIS — Z7982 Long term (current) use of aspirin: Secondary | ICD-10-CM | POA: Diagnosis not present

## 2020-10-22 DIAGNOSIS — K219 Gastro-esophageal reflux disease without esophagitis: Secondary | ICD-10-CM | POA: Diagnosis not present

## 2020-10-22 DIAGNOSIS — I10 Essential (primary) hypertension: Secondary | ICD-10-CM | POA: Diagnosis not present

## 2020-10-22 NOTE — Telephone Encounter (Signed)
Luis Ford from [place] called([phone]).  She wanted Dr. Romeo Apple to know that today was his last home visit.  She said that he will need outpatient orders.  Marcelino Duster further stated that she wanted Dr. Romeo Apple to know that Luis Ford's BP was elevated today.  She said that it was 156/94.  She thinks that maybe it was elevated due to his activity, walking around and his exercises.  Thanks

## 2020-10-22 NOTE — Telephone Encounter (Signed)
Put in orders

## 2020-10-24 ENCOUNTER — Other Ambulatory Visit: Payer: Self-pay

## 2020-10-24 ENCOUNTER — Encounter: Payer: Self-pay | Admitting: Orthopedic Surgery

## 2020-10-24 ENCOUNTER — Ambulatory Visit (INDEPENDENT_AMBULATORY_CARE_PROVIDER_SITE_OTHER): Payer: BC Managed Care – PPO | Admitting: Orthopedic Surgery

## 2020-10-24 VITALS — BP 148/97 | HR 109 | Ht 75.0 in

## 2020-10-24 DIAGNOSIS — Z96651 Presence of right artificial knee joint: Secondary | ICD-10-CM

## 2020-10-24 NOTE — Progress Notes (Signed)
POV 1   Chief Complaint  Patient presents with  . Knee Pain    Right  knee, s/p 10/09/20.    Encounter Diagnosis  Name Primary?  . S/P total knee replacement, right 10/09/20  Yes    Right postop day #15  Staples were removed wound looks good his flexion is past 90 degrees he is walking with a cane he is very happy with his replacement  He can start postop outpatient therapy and see me in 4 weeks   PRE-OPERATIVE DIAGNOSIS: Right knee osteoarthritis  POST-OPERATIVE DIAGNOSIS: Right knee osteoarthritis  PROCEDURE:Procedure(s): RIGHT TOTAL KNEE ARTHROPLASTY (Right)  DEPUY PS FB ATTUNE  84F 7 T 5 POLY 38 P  Medication List    STOP taking these medications   acetaminophen 500 MG tablet Commonly known as: TYLENOL   diclofenac 75 MG EC tablet Commonly known as: VOLTAREN   HYDROcodone-acetaminophen 5-325 MG tablet Commonly known as: NORCO/VICODIN   Vitamin D3 125 MCG (5000 UT) Caps     TAKE these medications   aspirin 81 MG chewable tablet Chew 1 tablet (81 mg total) by mouth 2 (two) times daily.   b complex vitamins capsule Take 1 capsule by mouth daily.   celecoxib 200 MG capsule Commonly known as: CELEBREX Take 1 capsule (200 mg total) by mouth 2 (two) times daily.   diclofenac Sodium 1 % Gel Commonly known as: VOLTAREN SMARTSIG:1 Inch(es) Topical 3 Times Daily What changed:   how much to take  how to take this  when to take this  additional instructions   docusate sodium 100 MG capsule Commonly known as: COLACE Take 1 capsule (100 mg total) by mouth 2 (two) times daily.   escitalopram 10 MG tablet Commonly known as: LEXAPRO Take 1 tablet (10 mg total) by mouth daily.   lisinopril-hydrochlorothiazide 20-12.5 MG tablet Commonly known as: ZESTORETIC Take 2 tablets by mouth daily.   metFORMIN 500 MG 24 hr tablet Commonly known as: GLUCOPHAGE-XR Take 500 mg by mouth daily before supper.   methocarbamol 500 MG  tablet Commonly known as: ROBAXIN Take 1 tablet (500 mg total) by mouth every 6 (six) hours as needed for up to 14 days for muscle spasms.   omeprazole 20 MG capsule Commonly known as: PRILOSEC Take 1 capsule (20 mg total) by mouth daily.   oxyCODONE-acetaminophen 5-325 MG tablet Commonly known as: PERCOCET/ROXICET Take 1 tablet by mouth every 4 (four) hours as needed for up to 7 days for severe pain.   rosuvastatin 40 MG tablet Commonly known as: Crestor Take 1 tablet (40 mg total) by mouth daily.   sildenafil 20 MG tablet Commonly known as: REVATIO Take 1-3 tablets (20-60 mg total) by mouth as needed.   traMADol 50 MG tablet Commonly known as: ULTRAM Take 1 tablet (50 mg total) by mouth every 6 (six) hours.

## 2020-10-30 ENCOUNTER — Other Ambulatory Visit: Payer: Self-pay

## 2020-10-30 ENCOUNTER — Ambulatory Visit (HOSPITAL_COMMUNITY): Payer: BC Managed Care – PPO | Attending: Orthopedic Surgery

## 2020-10-30 DIAGNOSIS — R262 Difficulty in walking, not elsewhere classified: Secondary | ICD-10-CM | POA: Diagnosis not present

## 2020-10-30 DIAGNOSIS — M6281 Muscle weakness (generalized): Secondary | ICD-10-CM | POA: Insufficient documentation

## 2020-10-30 DIAGNOSIS — R2689 Other abnormalities of gait and mobility: Secondary | ICD-10-CM | POA: Diagnosis not present

## 2020-10-30 NOTE — Therapy (Signed)
[facility] [address] [place], [place], [address] Phone: [phone]   Fax:  [phone]  Physical Therapy Evaluation  Patient Details  Name: Luis Ford MRN: [id number] Date of Birth: 01/29/1961 Referring Provider (PT): Vickki Hearing, MD   Encounter Date: 10/30/2020   PT End of Session - 10/30/20 1501    Visit Number 1    Number of Visits 12    Date for PT Re-Evaluation 12/11/20    Authorization Type BCBS Comm PPO, no auth, 90 VL (88 remaining)    Authorization - Visit Number 1    Authorization - Number of Visits 88    Progress Note Due on Visit 10    PT Start Time 1447    PT Stop Time 1532    PT Time Calculation (min) 45 min    Activity Tolerance Patient tolerated treatment well;Patient limited by fatigue    Behavior During Therapy [facility] for tasks assessed/performed           Past Medical History:  Diagnosis Date  . Asthma   . Complication of anesthesia    patient states he can only have spinal anesthesia.    . Complication of anesthesia    Patient stopped breathing after geneal anesthesia.  . Diabetes mellitus without complication (HCC)   . Hyperlipemia   . Hypertension   . MRSA infection   . Sleep apnea   . Toe amputee [facility])     Past Surgical History:  Procedure Laterality Date  . APPENDECTOMY    . COLONOSCOPY  2003   [facility]-normal  . COLONOSCOPY  04/15/2012   Procedure: COLONOSCOPY;  Surgeon: Corbin Ade, MD;  Location: [facility] ENDO [facility]ITE;  Service: Endoscopy;  Laterality: N/A;  8:45  . HERNIA REPAIR     right inguinal  . TOE AMPUTATION    . TOTAL KNEE ARTHROPLASTY Right 10/09/2020   Procedure: RIGHT TOTAL KNEE ARTHROPLASTY;  Surgeon: Vickki Hearing, MD;  Location: [facility] ORS;  Service: Orthopedics;  Laterality: Right;  . TRACHEOSTOMY     infant/closure as child  . TRACHEOSTOMY     pt was 82 days old when he had a trach placed.     There were no vitals filed for this visit.    Subjective  Assessment - 10/30/20 1459    Subjective Right TKR on 10/09/20 and had HHPT 3x/wk  for 6-7 visits. Pt reports he has been completing HEP from on his own since. Pt reports he is feeling better and feels comfortable walking without an AD    Currently in Pain? No/denies    Pain Score 0-No pain    Pain Location Knee    Pain Orientation Right              [facility] PT Assessment - 10/30/20 0001      Assessment   Medical Diagnosis R TKR    Referring Provider (PT) Vickki Hearing, MD    Onset Date/Surgical Date 10/09/20    Next MD Visit 11/22/20    Prior Therapy HHPT      Balance Screen   Has the patient fallen in the past 6 months No    Has the patient had a decrease in activity level because of a fear of falling?  No    Is the patient reluctant to leave their home because of a fear of falling?  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    Leisure Retail buyer, fishing      Observation/Other Assessments   Focus on Therapeutic Outcomes (FOTO)  75% function      ROM / Strength   AROM / PROM / Strength AROM;Strength      AROM   AROM Assessment Site Knee    Right/Left Knee Right    Right Knee Extension 8   lacking   Right Knee Flexion 112      Strength   Strength Assessment Site Knee    Right/Left Knee Right    Right Knee Flexion 4-/5    Right Knee Extension 3+/5      Transfers   Transfers Sit to Stand;Stand Pivot Transfers    Sit to Stand 7: Independent    Stand Pivot Transfers 7: Independent      Ambulation/Gait   Ambulation/Gait Yes    Ambulation/Gait Assistance 7: Independent    Ambulation  Distance (Feet) 300 Feet    Assistive device None    Gait Pattern Step-through pattern;Lateral trunk lean to right    Gait Comments                      Objective measurements completed on examination: See above findings.       [facility] Adult PT Treatment/Exercise - 10/30/20 0001      Exercises   Exercises Knee/Hip      Knee/Hip Exercises: Stretches   Quad Stretch Right;2 reps;60 seconds      Knee/Hip Exercises: Standing   Heel Raises Both;2 sets;10 reps      Knee/Hip Exercises: Seated   Sit to Sand 2 sets;10 reps      Knee/Hip Exercises: Prone   Hamstring Curl 2 sets;10 reps                  PT Education - 10/30/20 1525    Education Details pt education on POC details and initiation of HEP with emphasis on strengthening and transferring with equal WBing    Person(s) Educated Patient    Methods Explanation    Comprehension Verbalized understanding            PT Short Term Goals - 10/30/20 1542      PT SHORT TERM GOAL #1   Title Patient will report at least 50% improvement in overall symptoms and function to demonstrate overall improved functional ability    Time 3    Period Weeks    Status New    Target Date 11/20/20      PT SHORT TERM GOAL #2   Title Patient will be independent with HEP in order to improve functional outcomes.    Time 3    Period Weeks    Status New    Target Date 11/20/20      PT SHORT TERM GOAL #3   Title Patient will be able to ambulate at least 400 feet in in order to demonstrate improved gait speed for community ambulation.    Baseline 300 ft no AD    Time 3    Period  Weeks    Status New    Target Date 11/20/20             PT Long Term Goals - 10/30/20 1543      PT LONG TERM GOAL #1   Title Patient will demo full right knee extension to ambulate with normalized pattern    Baseline lacking 8 degrees    Time 6    Period Weeks    Status New    Target Date 12/11/20      PT LONG TERM GOAL #2    Title Demo right knee flexion 120 degrees to enable mobility    Baseline 112    Time 6    Period Weeks    Status New    Target Date 12/11/20      PT LONG TERM GOAL #3   Title Patient will improve on FOTO score to meet predicted outcomes to improve functional independence    Baseline 75% function    Time 6    Period Weeks    Status New    Target Date 12/11/20                  Plan - 10/30/20 1538    Clinical Impression Statement Patient is 60 yo male s/p right TKR who presents to clinic today with difficulty in walking, unsteadiness on feet, limited right knee ROM and generalized weakness after surgery and requires PT services to increase RLE ROM, strength, balance, coordination, and ambulation capabilities to facilitate return to normal activities and prepare for return to work    Personal Factors and Comorbidities Comorbidity 1;Time since onset of injury/illness/exacerbation    Comorbidities DM    Examination-Activity Limitations Bend;Carry;Lift;Stairs;Squat;Locomotion Level;Transfers    Examination-Participation Restrictions Cleaning;Yard Work;Occupation    Stability/Clinical Decision Making Stable/Uncomplicated    Clinical Decision Making Low    Rehab Potential Excellent    PT Frequency 2x / week    PT Duration 6 weeks    PT Treatment/Interventions ADLs/Self Care Home Management;Aquatic Therapy;Cryotherapy;Electrical Stimulation;DME Instruction;Gait training;Stair training;Functional mobility training;Therapeutic activities;Therapeutic exercise;Balance training;Patient/family education;Neuromuscular re-education;Manual techniques;Passive range of motion;Joint Manipulations;Spinal Manipulations    PT Next Visit Plan Continue with WBing strengthening exercises    PT Home Exercise Plan prone hamstring, prone quad, sit to stand for symmetry, calf raise    Consulted and Agree with Plan of Care Patient           Patient will benefit from skilled therapeutic intervention  in order to improve the following deficits and impairments:  Abnormal gait,Decreased activity tolerance,Decreased balance,Decreased endurance,Decreased range of motion,Decreased strength,Difficulty walking,Improper body mechanics  Visit Diagnosis: Difficulty in walking, not elsewhere classified  Muscle weakness (generalized)  Other abnormalities of gait and mobility     Problem List Patient Active Problem List   Diagnosis Date Noted  . S/P total knee replacement, right 10/09/20  10/22/2020  . Osteoarthritis of right knee 10/09/2020  . Primary osteoarthritis of right knee   . Type 2 diabetes mellitus with other specified complication (HCC) 04/15/2019  . Diabetic foot ulcer (HCC) 04/15/2019  . Anxiety 07/04/2016  . GERD (gastroesophageal reflux disease) 07/04/2016  . Hyperlipidemia 04/14/2016  . Essential hypertension 02/20/2015  . Family history of cancer 03/18/2012  . Screening for colon cancer 03/18/2012   3:50 PM, 10/30/20 M. Shary Decamp, PT, DPT Physical Therapist- [place] Office Number: [phone]  [facility] [address] [place], [place], [address] Phone: [phone]   Fax:  [phone]  Name: Clay Solum  Iverson MRN: [id number] Date of Birth: 03/19/1961

## 2020-10-30 NOTE — Patient Instructions (Signed)
Access Code: [email or web address] URL: [email or web address] Date: 10/30/2020 Prepared by: Shary Decamp  Exercises Prone Knee Flexion AROM - 1 x daily - 7 x weekly - 3 sets - 10 reps Prone Quadriceps Stretch with Strap - 2 x daily - 7 x weekly - 3 sets - 3 reps - 60 hold Sit to Stand Without Arm Support - 2 x daily - 7 x weekly - 3 sets - 10 reps Heel rises with counter support - 1 x daily - 7 x weekly - 3 sets - 10 reps

## 2020-11-01 ENCOUNTER — Ambulatory Visit (HOSPITAL_COMMUNITY): Payer: BC Managed Care – PPO | Admitting: Physical Therapy

## 2020-11-06 ENCOUNTER — Other Ambulatory Visit: Payer: Self-pay

## 2020-11-06 ENCOUNTER — Ambulatory Visit (HOSPITAL_COMMUNITY): Payer: BC Managed Care – PPO | Admitting: Physical Therapy

## 2020-11-06 ENCOUNTER — Encounter (HOSPITAL_COMMUNITY): Payer: Self-pay | Admitting: Physical Therapy

## 2020-11-06 DIAGNOSIS — R262 Difficulty in walking, not elsewhere classified: Secondary | ICD-10-CM | POA: Diagnosis not present

## 2020-11-06 DIAGNOSIS — M6281 Muscle weakness (generalized): Secondary | ICD-10-CM | POA: Diagnosis not present

## 2020-11-06 DIAGNOSIS — R2689 Other abnormalities of gait and mobility: Secondary | ICD-10-CM

## 2020-11-06 NOTE — Therapy (Signed)
[facility] [address] [place], [place], [address] Phone: [phone]   Fax:  [phone]  Physical Therapy Treatment  Patient Details  Name: Luis Ford MRN: [id number] Date of Birth: Nov 07, 1960 Referring Provider (PT): Vickki Hearing, MD   Encounter Date: 11/06/2020   PT End of Session - 11/06/20 1402    Visit Number 2    Number of Visits 12    Date for PT Re-Evaluation 12/11/20    Authorization Type BCBS Comm PPO, no auth, 90 VL (88 remaining)    Authorization - Visit Number 2    Authorization - Number of Visits 88    Progress Note Due on Visit 10    PT Start Time 1402    PT Stop Time 1442    PT Time Calculation (min) 40 min    Activity Tolerance Patient tolerated treatment well;Patient limited by fatigue    Behavior During Therapy [facility] for tasks assessed/performed           Past Medical History:  Diagnosis Date  . Asthma   . Complication of anesthesia    patient states he can only have spinal anesthesia.    . Complication of anesthesia    Patient stopped breathing after geneal anesthesia.  . Diabetes mellitus without complication (HCC)   . Hyperlipemia   . Hypertension   . MRSA infection   . Sleep apnea   . Toe amputee [facility])     Past Surgical History:  Procedure Laterality Date  . APPENDECTOMY    . COLONOSCOPY  2003   [facility]-normal  . COLONOSCOPY  04/15/2012   Procedure: COLONOSCOPY;  Surgeon: Corbin Ade, MD;  Location[facility];  Service: Endoscopy;  Laterality: N/A;  8:45  . HERNIA REPAIR     right inguinal  . TOE AMPUTATION    . TOTAL KNEE ARTHROPLASTY Right 10/09/2020   Procedure: RIGHT TOTAL KNEE ARTHROPLASTY;  Surgeon: Vickki Hearing, MD;  Location: [facility] ORS;  Service: Orthopedics;  Laterality: Right;  . TRACHEOSTOMY     infant/closure as child  . TRACHEOSTOMY     pt was 67 days old when he had a trach placed.     There were no vitals filed for this visit.   Subjective Assessment  - 11/06/20 1402    Subjective Patient states his pain has gone away pretty much. He has been walking and doing yard work. His home exercises are going well.    Currently in Pain? No/denies                             [facility] Adult PT Treatment/Exercise - 11/06/20 0001      Knee/Hip Exercises: Aerobic   Recumbent Bike 5 minutes dynamic warm up level 4      Knee/Hip Exercises: Standing   Heel Raises Both;1 set;20 reps    Knee Flexion 3 sets;10 reps;Both    Knee Flexion Limitations 2#    Forward Step Up Right;2 sets;10 reps;Hand Hold: 1;Step Height: 6"    Rocker Board 2 minutes    Rocker Board Limitations lateral    SLS with Vectors 5x 5 second holds bilateral      Knee/Hip Exercises: Seated   Sit to Sand 2 sets;10 reps      Knee/Hip Exercises: Supine   Straight Leg Raises Right;1 set;15 reps    Knee Extension Right    Knee Extension Limitations lacking 2  Knee Flexion AROM    Knee Flexion Limitations 122                  PT Education - 11/06/20 1402    Education Details HEP, exercise mechanics    Person(s) Educated Patient    Methods Explanation;Demonstration    Comprehension Verbalized understanding;Returned demonstration            PT Short Term Goals - 10/30/20 1542      PT SHORT TERM GOAL #1   Title Patient will report at least 50% improvement in overall symptoms and function to demonstrate overall improved functional ability    Time 3    Period Weeks    Status New    Target Date 11/20/20      PT SHORT TERM GOAL #2   Title Patient will be independent with HEP in order to improve functional outcomes.    Time 3    Period Weeks    Status New    Target Date 11/20/20      PT SHORT TERM GOAL #3   Title Patient will be able to ambulate at least 400 feet in in order to demonstrate improved gait speed for community ambulation.    Baseline 300 ft no AD    Time 3    Period Weeks    Status New    Target Date 11/20/20              PT Long Term Goals - 10/30/20 1543      PT LONG TERM GOAL #1   Title Patient will demo full right knee extension to ambulate with normalized pattern    Baseline lacking 8 degrees    Time 6    Period Weeks    Status New    Target Date 12/11/20      PT LONG TERM GOAL #2   Title Demo right knee flexion 120 degrees to enable mobility    Baseline 112    Time 6    Period Weeks    Status New    Target Date 12/11/20      PT LONG TERM GOAL #3   Title Patient will improve on FOTO score to meet predicted outcomes to improve functional independence    Baseline 75% function    Time 6    Period Weeks    Status New    Target Date 12/11/20                 Plan - 11/06/20 1402    Clinical Impression Statement Patient showing improving ROM from lacking 2 to 122 following dynamic warm up on bike. Patient given cueing for equal weight bearing with STS with fair carry over. Patient requires unilateral UE support for balance and quad strength deficit. Patient requires unilateral UE support for balance with vector stance secondary to impaired single leg balance. Patient progressing very well.  Patient will continue to benefit from skilled physical therapy in order to reduce impairment and improve function.    Personal Factors and Comorbidities Comorbidity 1;Time since onset of injury/illness/exacerbation    Comorbidities DM    Examination-Activity Limitations Bend;Carry;Lift;Stairs;Squat;Locomotion Level;Transfers    Examination-Participation Restrictions Cleaning;Yard Work;Occupation    Stability/Clinical Decision Making Stable/Uncomplicated    Rehab Potential Excellent    PT Frequency 2x / week    PT Duration 6 weeks    PT Treatment/Interventions ADLs/Self Care Home Management;Aquatic Therapy;Cryotherapy;Electrical Stimulation;DME Instruction;Gait training;Stair training;Functional mobility training;Therapeutic activities;Therapeutic exercise;Balance training;Patient/family  education;Neuromuscular re-education;Manual techniques;Passive range  of motion;Joint Manipulations;Spinal Manipulations    PT Next Visit Plan Continue with WBing strengthening exercises    PT Home Exercise Plan prone hamstring, prone quad, sit to stand for symmetry, calf raise 4/19 step up    Consulted and Agree with Plan of Care Patient           Patient will benefit from skilled therapeutic intervention in order to improve the following deficits and impairments:  Abnormal gait,Decreased activity tolerance,Decreased balance,Decreased endurance,Decreased range of motion,Decreased strength,Difficulty walking,Improper body mechanics  Visit Diagnosis: Difficulty in walking, not elsewhere classified  Muscle weakness (generalized)  Other abnormalities of gait and mobility     Problem List Patient Active Problem List   Diagnosis Date Noted  . S/P total knee replacement, right 10/09/20  10/22/2020  . Osteoarthritis of right knee 10/09/2020  . Primary osteoarthritis of right knee   . Type 2 diabetes mellitus with other specified complication (HCC) 04/15/2019  . Diabetic foot ulcer (HCC) 04/15/2019  . Anxiety 07/04/2016  . GERD (gastroesophageal reflux disease) 07/04/2016  . Hyperlipidemia 04/14/2016  . Essential hypertension 02/20/2015  . Family history of cancer 03/18/2012  . Screening for colon cancer 03/18/2012    2:38 PM, 11/06/20 Wyman Songster PT, DPT Physical Therapist at [facility]   [facility] [facility] [address] [place], [place], [address] Phone: [phone]   Fax:  [phone]  Name: Luis Ford MRN: [id number] Date of Birth: 1960-08-07

## 2020-11-06 NOTE — Patient Instructions (Signed)
Access Code: [email or web address] URL: [email or web address] Date: 11/06/2020 Prepared by: Greig Castilla Makyle Eslick  Exercises Step Up - 1 x daily - 7 x weekly - 3 sets - 10 reps

## 2020-11-08 ENCOUNTER — Ambulatory Visit (HOSPITAL_COMMUNITY): Payer: BC Managed Care – PPO | Admitting: Physical Therapy

## 2020-11-08 ENCOUNTER — Telehealth (HOSPITAL_COMMUNITY): Payer: Self-pay | Admitting: Physical Therapy

## 2020-11-08 NOTE — Telephone Encounter (Signed)
Pt did not show for appt.  Called and left message regarding next appt on Tuesday.  Lurena Nida, PTA/CLT [phone]

## 2020-11-13 ENCOUNTER — Ambulatory Visit (HOSPITAL_COMMUNITY): Payer: BC Managed Care – PPO

## 2020-11-13 ENCOUNTER — Encounter (HOSPITAL_COMMUNITY): Payer: Self-pay

## 2020-11-13 ENCOUNTER — Other Ambulatory Visit: Payer: Self-pay

## 2020-11-13 DIAGNOSIS — R262 Difficulty in walking, not elsewhere classified: Secondary | ICD-10-CM

## 2020-11-13 DIAGNOSIS — M6281 Muscle weakness (generalized): Secondary | ICD-10-CM | POA: Diagnosis not present

## 2020-11-13 DIAGNOSIS — R2689 Other abnormalities of gait and mobility: Secondary | ICD-10-CM

## 2020-11-13 NOTE — Therapy (Signed)
[facility] [address] [place], [place], [address] Phone: [phone]   Fax:  [phone]  Physical Therapy Treatment  Patient Details  Name: Luis Ford MRN: [id number] Date of Birth: [birth year] Referring Provider (PT): Vickki Hearing, MD   Encounter Date: 11/13/2020   PT End of Session - 11/13/20 1351    Visit Number 3    Number of Visits 12    Date for PT Re-Evaluation 12/11/20    Authorization Type BCBS Comm PPO, no auth, 90 VL (88 remaining)    Authorization - Visit Number 3    Authorization - Number of Visits 88    Progress Note Due on Visit 10    PT Start Time 1345    PT Stop Time 1430    PT Time Calculation (min) 45 min    Activity Tolerance Patient tolerated treatment well;Patient limited by fatigue    Behavior During Therapy [facility] for tasks assessed/performed           Past Medical History:  Diagnosis Date  . Asthma   . Complication of anesthesia    patient states he can only have spinal anesthesia.    . Complication of anesthesia    Patient stopped breathing after geneal anesthesia.  . Diabetes mellitus without complication (HCC)   . Hyperlipemia   . Hypertension   . MRSA infection   . Sleep apnea   . Toe amputee [facility])     Past Surgical History:  Procedure Laterality Date  . APPENDECTOMY    . COLONOSCOPY  2003   [facility]-normal  . COLONOSCOPY  04/15/2012   Procedure: COLONOSCOPY;  Surgeon: Corbin Ade, MD;  Location: [facility];  Service: Endoscopy;  Laterality: N/A;  8:45  . HERNIA REPAIR     right inguinal  . TOE AMPUTATION    . TOTAL KNEE ARTHROPLASTY Right 10/09/2020   Procedure: RIGHT TOTAL KNEE ARTHROPLASTY;  Surgeon: Vickki Hearing, MD;  Location: [facility] ORS;  Service: Orthopedics;  Laterality: Right;  . TRACHEOSTOMY     infant/closure as child  . TRACHEOSTOMY     pt was 66 days old when he had a trach placed.     There were no vitals filed for this visit.   Subjective Assessment  - 11/13/20 1416    Subjective reports no pain or limitations and notes some tenderness along incision    Currently in Pain? No/denies    Pain Score 0-No pain    Pain Location Knee    Pain Orientation Right              [facility] PT Assessment - 11/13/20 0001      Assessment   Medical Diagnosis R TKR    Referring Provider (PT) Vickki Hearing, MD    Onset Date/Surgical Date 10/09/20    Next MD Visit 11/22/20    Prior Therapy [facility]PT                         [facility] Adult PT Treatment/Exercise - 11/13/20 0001      Ambulation/Gait   Gait velocity - backwards 225 ft      Knee/Hip Exercises: Machines for Strengthening   Cybex Knee Flexion 8.5 plates, 7Y19      Knee/Hip Exercises: Standing   Heel Raises Both;3 sets;10 reps    Knee Flexion Strengthening;Right;3 sets;10 reps    Knee Flexion Limitations 5 lbs    Hip Flexion Stengthening;Both;1 set  Hip Flexion Limitations 2 min stair taps 12" step 5 lbs    Gait Training resisted retro/forward 5 plates x 2 min    Other Standing Knee Exercises sidestepping x 2 min 5 lbs      Knee/Hip Exercises: Seated   Long Arc Quad Strengthening;Right;3 sets;15 reps    Long Arc Quad Weight 10 lbs.    Sit to Sand 3 sets;10 reps   26" seat height     Knee/Hip Exercises: Supine   Heel Slides AAROM;Right;3 sets;10 reps    Knee Extension Right    Knee Extension Limitations lacking 2    Knee Flexion AROM    Knee Flexion Limitations 125      Knee/Hip Exercises: Prone   Hamstring Curl 2 sets;10 reps    Hamstring Curl Limitations performed in between knee hangs    Prone Knee Hang 2 minutes   2 sets                 PT Education - 11/13/20 1417    Education Details HEP and body mechanics to improve symmetric take-off/sit to stand    Person(s) Educated Patient    Methods Explanation;Demonstration    Comprehension Verbalized understanding            PT Short Term Goals - 10/30/20 1542      PT SHORT TERM GOAL #1    Title Patient will report at least 50% improvement in overall symptoms and function to demonstrate overall improved functional ability    Time 3    Period Weeks    Status New    Target Date 11/20/20      PT SHORT TERM GOAL #2   Title Patient will be independent with HEP in order to improve functional outcomes.    Time 3    Period Weeks    Status New    Target Date 11/20/20      PT SHORT TERM GOAL #3   Title Patient will be able to ambulate at least 400 feet in in order to demonstrate improved gait speed for community ambulation.    Baseline 300 ft no AD    Time 3    Period Weeks    Status New    Target Date 11/20/20             PT Long Term Goals - 10/30/20 1543      PT LONG TERM GOAL #1   Title Patient will demo full right knee extension to ambulate with normalized pattern    Baseline lacking 8 degrees    Time 6    Period Weeks    Status New    Target Date 12/11/20      PT LONG TERM GOAL #2   Title Demo right knee flexion 120 degrees to enable mobility    Baseline 112    Time 6    Period Weeks    Status New    Target Date 12/11/20      PT LONG TERM GOAL #3   Title Patient will improve on FOTO score to meet predicted outcomes to improve functional independence    Baseline 75% function    Time 6    Period Weeks    Status New    Target Date 12/11/20                 Plan - 11/13/20 1423    Clinical Impression Statement Pt demonstrating excellent activity tolerance and progressing very well with right knee  flexion and improving tolerance for knee extension and increased strength evident by increase in resistance and repetition of exercises without fatigue and minimal cues for body mechanics. Continued POC indicated to increase RLE strength    Personal Factors and Comorbidities Comorbidity 1;Time since onset of injury/illness/exacerbation    Comorbidities DM    Examination-Activity Limitations Bend;Carry;Lift;Stairs;Squat;Locomotion Level;Transfers     Examination-Participation Restrictions Cleaning;Yard Work;Occupation    Stability/Clinical Decision Making Stable/Uncomplicated    Rehab Potential Excellent    PT Frequency 2x / week    PT Duration 6 weeks    PT Treatment/Interventions ADLs/Self Care Home Management;Aquatic Therapy;Cryotherapy;Electrical Stimulation;DME Instruction;Gait training;Stair training;Functional mobility training;Therapeutic activities;Therapeutic exercise;Balance training;Patient/family education;Neuromuscular re-education;Manual techniques;Passive range of motion;Joint Manipulations;Spinal Manipulations    PT Next Visit Plan Continue with WBing strengthening exercises    PT Home Exercise Plan prone hamstring, prone quad, sit to stand for symmetry, calf raise 4/19 step up    Consulted and Agree with Plan of Care Patient           Patient will benefit from skilled therapeutic intervention in order to improve the following deficits and impairments:  Abnormal gait,Decreased activity tolerance,Decreased balance,Decreased endurance,Decreased range of motion,Decreased strength,Difficulty walking,Improper body mechanics  Visit Diagnosis: Difficulty in walking, not elsewhere classified  Muscle weakness (generalized)  Other abnormalities of gait and mobility     Problem List Patient Active Problem List   Diagnosis Date Noted  . S/P total knee replacement, right 10/09/20  10/22/2020  . Osteoarthritis of right knee 10/09/2020  . Primary osteoarthritis of right knee   . Type 2 diabetes mellitus with other specified complication (HCC) 04/15/2019  . Diabetic foot ulcer (HCC) 04/15/2019  . Anxiety 07/04/2016  . GERD (gastroesophageal reflux disease) 07/04/2016  . Hyperlipidemia 04/14/2016  . Essential hypertension 02/20/2015  . Family history of cancer 03/18/2012  . Screening for colon cancer 03/18/2012   2:25 PM, 11/13/20 M. Shary Decamp, PT, DPT Physical Therapist- [place] Office Number:  [phone]  [facility] [address] [place], [place], [address] Phone: [phone]   Fax:  [phone]  Name: Luis Ford MRN: [id number] Date of Birth: [birth year]

## 2020-11-15 ENCOUNTER — Telehealth (HOSPITAL_COMMUNITY): Payer: Self-pay

## 2020-11-15 ENCOUNTER — Ambulatory Visit (HOSPITAL_COMMUNITY): Payer: BC Managed Care – PPO

## 2020-11-15 NOTE — Telephone Encounter (Signed)
No Show #2. Therapist called pt regarding No Show today at 11:30. Left voicemail with next appointment time and clinic phone number, encouraged pt to call to reschedule if needed or with any questions.   Domenick Bookbinder PT, DPT 11/15/20, 11:58 AM

## 2020-11-20 ENCOUNTER — Ambulatory Visit (HOSPITAL_COMMUNITY): Payer: BC Managed Care – PPO | Attending: Orthopedic Surgery

## 2020-11-20 ENCOUNTER — Other Ambulatory Visit: Payer: Self-pay

## 2020-11-20 DIAGNOSIS — M6281 Muscle weakness (generalized): Secondary | ICD-10-CM | POA: Diagnosis not present

## 2020-11-20 DIAGNOSIS — R262 Difficulty in walking, not elsewhere classified: Secondary | ICD-10-CM

## 2020-11-20 DIAGNOSIS — R2689 Other abnormalities of gait and mobility: Secondary | ICD-10-CM

## 2020-11-20 NOTE — Therapy (Signed)
[facility] [address] [place], [place], [address] Phone: [phone]   Fax:  [phone]  Physical Therapy Treatment and Discharge Summary  Patient Details  Name: Luis Ford MRN: [id number] Date of Birth: 08-15-1960 Referring Provider (PT): Carole Civil, MD  PHYSICAL THERAPY DISCHARGE SUMMARY  Visits from Start of Care: 4  Current functional level related to goals / functional outcomes: Pt has met all STG/LTG and demo 98% function via [employer]   Remaining deficits: None   Education / Equipment: Comprehensive HEP Plan: Patient agrees to discharge.  Patient goals were met. Patient is being discharged due to meeting the stated rehab goals.  ?????      Encounter Date: 11/20/2020   PT End of Session - 11/20/20 1301    Visit Number 4    Number of Visits 12    Date for PT Re-Evaluation 12/11/20    Authorization Type BCBS Comm PPO, no auth, 90 VL (88 remaining)    Authorization - Visit Number 4    Authorization - Number of Visits 88    Progress Note Due on Visit 10    PT Start Time 6160    PT Stop Time 1333    PT Time Calculation (min) 38 min    Activity Tolerance Patient tolerated treatment well;Patient limited by fatigue    Behavior During Therapy W[facility] for tasks assessed/performed           Past Medical History:  Diagnosis Date  . Asthma   . Complication of anesthesia    patient states he can only have spinal anesthesia.    . Complication of anesthesia    Patient stopped breathing after geneal anesthesia.  . Diabetes mellitus without complication ([place])   . Hyperlipemia   . Hypertension   . MRSA infection   . Sleep apnea   . Toe amputee [facility])     Past Surgical History:  Procedure Laterality Date  . APPENDECTOMY    . COLONOSCOPY  2003   [facility]-normal  . COLONOSCOPY  04/15/2012   Procedure: COLONOSCOPY;  Surgeon: Daneil Dolin, MD;  [facility];  Service: Endoscopy;  Laterality: N/A;   8:45  . HERNIA REPAIR     right inguinal  . TOE AMPUTATION    . TOTAL KNEE ARTHROPLASTY Right 10/09/2020   Procedure: RIGHT TOTAL KNEE ARTHROPLASTY;  Surgeon: Carole Civil, MD;  Location: [facility] ORS;  Service: Orthopedics;  Laterality: Right;  . TRACHEOSTOMY     infant/closure as child  . TRACHEOSTOMY     pt was 68 days old when he had a trach placed.     There were no vitals filed for this visit.   Subjective Assessment - 11/20/20 1301    Subjective Feeling better and I'm ready to go back to work    Currently in Pain? No/denies    Pain Score 0-No pain    Pain Location Knee    Pain Orientation Right              [facility] PT Assessment - 11/20/20 0001      Assessment   Medical Diagnosis R TKR    Referring Provider (PT) Carole Civil, MD    Onset Date/Surgical Date 10/09/20    Next MD Visit 11/22/20      Observation/Other Assessments   Focus on Therapeutic Outcomes (FOTO)  98% function      AROM   Right Knee Extension 1   lacking 1-2 degrees extension,  soft end-feel and able to push into full extension   Right Knee Flexion 125      Strength   Right Knee Flexion 5/5    Right Knee Extension 5/5      Transfers   Transfers Sit to Stand;Stand Pivot Transfers    Sit to Stand 7: Independent      Ambulation/Gait   Ambulation/Gait Yes    Ambulation/Gait Assistance 7: Independent    Ambulation Distance (Feet) 400 Feet    Assistive device None    Gait Pattern Within Functional Limits    Ambulation Surface Level;Indoor    Gait velocity normal    Stairs Yes    Stairs Assistance 7: Independent    Stair Management Technique No rails;Alternating pattern    Gait Comments 2MWT                         [facility] Adult PT Treatment/Exercise - 11/20/20 0001      Knee/Hip Exercises: Stretches   Quad Stretch 4 reps;60 seconds      Knee/Hip Exercises: Standing   Knee Flexion Strengthening;Right;3 sets;10 reps    Knee Flexion Limitations 10 lbs    Hip Flexion  Stengthening;Both;1 set    Hip Flexion Limitations 2 min stair taps 12" step 10 lbs    Gait Training resisted retro/forward 6 plates x 4 min    Other Standing Knee Exercises sidestepping x 2 min 10 lbs      Knee/Hip Exercises: Seated   Long Arc Quad Strengthening;Right;3 sets;10 reps   10 lbs, then 20 lbs (6x10 reps)   Long Arc Quad Weight 10 lbs.      Knee/Hip Exercises: Supine   Heel Prop for Knee Extension --   3x10 reps   Knee Extension Right    Knee Extension Limitations lacking 1-2, soft end-feel, able to push into full extension    Knee Flexion AROM    Knee Flexion Limitations 125      Knee/Hip Exercises: Prone   Hamstring Curl 3 sets;10 reps    Hamstring Curl Limitations strong manual resistance                  PT Education - 11/20/20 1334    Education Details education in HEP activities and progression of body weight resisted exercises to improve strength and conditioning    Person(s) Educated Patient    Methods Explanation;Demonstration    Comprehension Verbalized understanding;Returned demonstration            PT Short Term Goals - 11/20/20 1325      PT SHORT TERM GOAL #1   Title Patient will report at least 50% improvement in overall symptoms and function to demonstrate overall improved functional ability    Baseline 100% better    Time 3    Period Weeks    Status Achieved    Target Date 11/20/20      PT SHORT TERM GOAL #2   Title Patient will be independent with HEP in order to improve functional outcomes.    Baseline Independent    Time 3    Period Weeks    Status Achieved    Target Date 11/20/20      PT SHORT TERM GOAL #3   Title Patient will be able to ambulate at least 400 feet in 2MWT in order to demonstrate improved gait speed for community ambulation.    Baseline 400 ft, no AD, pattern WNL    Time 3  Period Weeks    Status Achieved    Target Date 11/20/20             PT Long Term Goals - 11/20/20 1328      PT LONG TERM  GOAL #1   Title Patient will demo full right knee extension to ambulate with normalized pattern    Baseline lacking 1-2 degrees terminal knee extension, soft end-feel and can push to full. 125 degrees flexion    Time 6    Period Weeks    Status Achieved      PT LONG TERM GOAL #2   Title Demo right knee flexion 120 degrees to enable mobility    Baseline 125    Time 6    Period Weeks    Status Achieved      PT LONG TERM GOAL #3   Title Patient will improve on FOTO score to meet predicted outcomes to improve functional independence    Baseline 98% function    Time 6    Period Weeks    Status Achieved                 Plan - 11/20/20 1337    Clinical Impression Statement Pt has progressed very well in a quick amount of time and demonstrates functional independence in all aspects and excellent activity tolerance and demo 5/5 strength and met all STG/LTG and reports no issues with pain and demonstrates mastery and compliance of HEP and reports he has been able to perform heavier work around his home without difficulty or incident.  Patient feels confident in his abilities and is comfortable in progressing his HEP for continued strength.  Pt reports he has MD appointment this Friday and is hopeful for a return to work soon.  Pt D/C from PT services at this time    Personal Factors and Comorbidities Comorbidity 1;Time since onset of injury/illness/exacerbation    Comorbidities DM    Examination-Participation Restrictions Cleaning;Yard Work;Occupation    Stability/Clinical Decision Making Stable/Uncomplicated    Rehab Potential Excellent    PT Frequency 2x / week    PT Duration 6 weeks    PT Treatment/Interventions ADLs/Self Care Home Management;Aquatic Therapy;Cryotherapy;Electrical Stimulation;DME Instruction;Gait training;Stair training;Functional mobility training;Therapeutic activities;Therapeutic exercise;Balance training;Patient/family education;Neuromuscular re-education;Manual  techniques;Passive range of motion;Joint Manipulations;Spinal Manipulations    PT Next Visit Plan D/C to HEP    PT Home Exercise Plan prone hamstring, prone quad, sit to stand for symmetry, calf raise 4/19 step up    Consulted and Agree with Plan of Care Patient           Patient will benefit from skilled therapeutic intervention in order to improve the following deficits and impairments:  Abnormal gait,Decreased activity tolerance,Decreased balance,Decreased endurance,Decreased range of motion,Decreased strength,Difficulty walking,Improper body mechanics  Visit Diagnosis: Difficulty in walking, not elsewhere classified  Muscle weakness (generalized)  Other abnormalities of gait and mobility     Problem List Patient Active Problem List   Diagnosis Date Noted  . S/P total knee replacement, right 10/09/20  10/22/2020  . Osteoarthritis of right knee 10/09/2020  . Primary osteoarthritis of right knee   . Type 2 diabetes mellitus with other specified complication ([place]) 74/16/3845  . Diabetic foot ulcer ([place]) 04/15/2019  . Anxiety 07/04/2016  . GERD (gastroesophageal reflux disease) 07/04/2016  . Hyperlipidemia 04/14/2016  . Essential hypertension 02/20/2015  . Family history of cancer 03/18/2012  . Screening for colon cancer 03/18/2012   1:41 PM, 11/20/20 M. Sherlyn Lees, PT, DPT Physical  Therapist- [place] Office Number: [place] [address] [place], [place], [address] Phone: [phone]   Fax:  [phone]  Name: Luis Ford MRN: [id number] Date of Birth: 1960/12/12

## 2020-11-21 ENCOUNTER — Encounter: Payer: BC Managed Care – PPO | Admitting: Orthopedic Surgery

## 2020-11-22 ENCOUNTER — Ambulatory Visit (HOSPITAL_COMMUNITY): Payer: BC Managed Care – PPO

## 2020-11-22 ENCOUNTER — Encounter: Payer: Self-pay | Admitting: Orthopedic Surgery

## 2020-11-22 ENCOUNTER — Ambulatory Visit (INDEPENDENT_AMBULATORY_CARE_PROVIDER_SITE_OTHER): Payer: BC Managed Care – PPO | Admitting: Orthopedic Surgery

## 2020-11-22 ENCOUNTER — Other Ambulatory Visit: Payer: Self-pay

## 2020-11-22 VITALS — Ht 75.0 in | Wt 238.0 lb

## 2020-11-22 DIAGNOSIS — Z4889 Encounter for other specified surgical aftercare: Secondary | ICD-10-CM

## 2020-11-22 DIAGNOSIS — Z96651 Presence of right artificial knee joint: Secondary | ICD-10-CM

## 2020-11-22 NOTE — Patient Instructions (Signed)
RTW Monday 5/9 22

## 2020-11-22 NOTE — Progress Notes (Signed)
Chief Complaint  Patient presents with  . Routine Post Op    S/P TKR Rt DOS 10/09/20    6 weeks POST OP    HE CONTINUES TO IMPROVE AND WAS RELEASED FROM PT  ROM 5-120  GAIT W/O LIMP  WOUND NORMAL    RTW MON 11/26/20  FU 6 WEEKS

## 2020-11-27 ENCOUNTER — Ambulatory Visit (HOSPITAL_COMMUNITY): Payer: BC Managed Care – PPO

## 2020-11-29 ENCOUNTER — Ambulatory Visit (HOSPITAL_COMMUNITY): Payer: BC Managed Care – PPO | Admitting: Physical Therapy

## 2020-12-13 ENCOUNTER — Ambulatory Visit: Payer: BC Managed Care – PPO | Admitting: Family Medicine

## 2020-12-14 ENCOUNTER — Encounter: Payer: Self-pay | Admitting: Family Medicine

## 2020-12-19 DIAGNOSIS — Z89422 Acquired absence of other left toe(s): Secondary | ICD-10-CM | POA: Diagnosis not present

## 2020-12-19 DIAGNOSIS — R197 Diarrhea, unspecified: Secondary | ICD-10-CM | POA: Diagnosis not present

## 2020-12-19 DIAGNOSIS — R0602 Shortness of breath: Secondary | ICD-10-CM | POA: Diagnosis not present

## 2020-12-19 DIAGNOSIS — Z89421 Acquired absence of other right toe(s): Secondary | ICD-10-CM | POA: Diagnosis not present

## 2020-12-19 DIAGNOSIS — R112 Nausea with vomiting, unspecified: Secondary | ICD-10-CM | POA: Diagnosis not present

## 2020-12-19 DIAGNOSIS — E860 Dehydration: Secondary | ICD-10-CM | POA: Diagnosis not present

## 2020-12-19 DIAGNOSIS — R9431 Abnormal electrocardiogram [ECG] [EKG]: Secondary | ICD-10-CM | POA: Diagnosis not present

## 2020-12-19 DIAGNOSIS — R001 Bradycardia, unspecified: Secondary | ICD-10-CM | POA: Diagnosis not present

## 2020-12-19 DIAGNOSIS — I10 Essential (primary) hypertension: Secondary | ICD-10-CM | POA: Diagnosis not present

## 2020-12-19 DIAGNOSIS — R4182 Altered mental status, unspecified: Secondary | ICD-10-CM | POA: Diagnosis not present

## 2020-12-19 DIAGNOSIS — R4020 Unspecified coma: Secondary | ICD-10-CM | POA: Diagnosis not present

## 2020-12-19 DIAGNOSIS — R0902 Hypoxemia: Secondary | ICD-10-CM | POA: Diagnosis not present

## 2020-12-19 DIAGNOSIS — R404 Transient alteration of awareness: Secondary | ICD-10-CM | POA: Diagnosis not present

## 2020-12-19 DIAGNOSIS — E119 Type 2 diabetes mellitus without complications: Secondary | ICD-10-CM | POA: Diagnosis not present

## 2020-12-19 DIAGNOSIS — E7800 Pure hypercholesterolemia, unspecified: Secondary | ICD-10-CM | POA: Diagnosis not present

## 2020-12-19 DIAGNOSIS — R55 Syncope and collapse: Secondary | ICD-10-CM | POA: Diagnosis not present

## 2021-01-03 ENCOUNTER — Ambulatory Visit: Payer: BC Managed Care – PPO | Admitting: Orthopedic Surgery

## 2021-03-20 ENCOUNTER — Other Ambulatory Visit: Payer: Self-pay | Admitting: Family

## 2021-03-20 DIAGNOSIS — G8929 Other chronic pain: Secondary | ICD-10-CM

## 2021-03-20 DIAGNOSIS — M25561 Pain in right knee: Secondary | ICD-10-CM

## 2021-04-15 ENCOUNTER — Ambulatory Visit: Payer: BC Managed Care – PPO | Admitting: Family Medicine

## 2021-04-22 ENCOUNTER — Encounter: Payer: Self-pay | Admitting: Family Medicine

## 2021-04-29 DIAGNOSIS — Z23 Encounter for immunization: Secondary | ICD-10-CM | POA: Diagnosis not present

## 2022-04-03 ENCOUNTER — Other Ambulatory Visit: Payer: Self-pay | Admitting: *Deleted

## 2022-04-03 DIAGNOSIS — R600 Localized edema: Secondary | ICD-10-CM | POA: Diagnosis not present

## 2022-04-03 DIAGNOSIS — E1169 Type 2 diabetes mellitus with other specified complication: Secondary | ICD-10-CM | POA: Diagnosis not present

## 2022-04-03 DIAGNOSIS — I10 Essential (primary) hypertension: Secondary | ICD-10-CM | POA: Diagnosis not present

## 2022-04-03 DIAGNOSIS — Z791 Long term (current) use of non-steroidal anti-inflammatories (NSAID): Secondary | ICD-10-CM | POA: Diagnosis not present

## 2022-04-03 DIAGNOSIS — E1142 Type 2 diabetes mellitus with diabetic polyneuropathy: Secondary | ICD-10-CM | POA: Diagnosis not present

## 2022-04-03 DIAGNOSIS — L03115 Cellulitis of right lower limb: Secondary | ICD-10-CM | POA: Diagnosis not present

## 2022-04-03 DIAGNOSIS — E119 Type 2 diabetes mellitus without complications: Secondary | ICD-10-CM | POA: Diagnosis not present

## 2022-04-03 DIAGNOSIS — E7800 Pure hypercholesterolemia, unspecified: Secondary | ICD-10-CM | POA: Diagnosis not present

## 2022-04-03 DIAGNOSIS — Z6828 Body mass index (BMI) 28.0-28.9, adult: Secondary | ICD-10-CM | POA: Diagnosis not present

## 2022-04-03 DIAGNOSIS — L03031 Cellulitis of right toe: Secondary | ICD-10-CM | POA: Diagnosis not present

## 2022-04-03 DIAGNOSIS — Z7984 Long term (current) use of oral hypoglycemic drugs: Secondary | ICD-10-CM | POA: Diagnosis not present

## 2022-04-03 DIAGNOSIS — B9561 Methicillin susceptible Staphylococcus aureus infection as the cause of diseases classified elsewhere: Secondary | ICD-10-CM | POA: Diagnosis not present

## 2022-04-03 DIAGNOSIS — L539 Erythematous condition, unspecified: Secondary | ICD-10-CM | POA: Diagnosis not present

## 2022-04-03 DIAGNOSIS — M868X7 Other osteomyelitis, ankle and foot: Secondary | ICD-10-CM | POA: Diagnosis not present

## 2022-04-03 DIAGNOSIS — M7989 Other specified soft tissue disorders: Secondary | ICD-10-CM | POA: Diagnosis not present

## 2022-04-03 DIAGNOSIS — M86171 Other acute osteomyelitis, right ankle and foot: Secondary | ICD-10-CM | POA: Diagnosis not present

## 2022-04-03 DIAGNOSIS — E669 Obesity, unspecified: Secondary | ICD-10-CM | POA: Diagnosis not present

## 2022-04-03 DIAGNOSIS — M869 Osteomyelitis, unspecified: Secondary | ICD-10-CM | POA: Diagnosis not present

## 2022-04-03 DIAGNOSIS — M79671 Pain in right foot: Secondary | ICD-10-CM | POA: Diagnosis not present

## 2022-04-03 DIAGNOSIS — Z79899 Other long term (current) drug therapy: Secondary | ICD-10-CM | POA: Diagnosis not present

## 2022-04-03 NOTE — Patient Outreach (Signed)
  Care Coordination   04/03/2022  Name: Luis Ford MRN: [id number] DOB: October 19, 1960   Care Coordination Outreach Attempts:  A second unsuccessful outreach was attempted today to offer the patient with information about available care coordination services as a benefit of their health plan.   HIPAA compliant message left on voicemail, providing contact information for CSW, encouraging patient to return CSW's call at his earliest convenience.  Follow Up Plan:  Additional outreach attempts will be made to offer the patient care coordination information and services.   Encounter Outcome:  No Answer.   Care Coordination Interventions Activated:  No.    Care Coordination Interventions:  No, not indicated.    Danford Bad, BSW, MSW, LCSW  Licensed Restaurant manager, fast food [facility] System  Mailing [place] [address] [place], [place] [address] Physical Address[address] [place], [place] [address] Toll Free Main # [phone] Fax # [phone] Cell # [phone] [email or web address]

## 2022-04-08 ENCOUNTER — Other Ambulatory Visit: Payer: Self-pay | Admitting: Family Medicine

## 2022-04-08 DIAGNOSIS — G8929 Other chronic pain: Secondary | ICD-10-CM

## 2022-04-10 ENCOUNTER — Other Ambulatory Visit: Payer: Self-pay | Admitting: *Deleted

## 2022-04-10 NOTE — Patient Outreach (Signed)
  Care Coordination   04/10/2022  Name: Luis Ford MRN: [id number] DOB: 1960-08-28   Care Coordination Outreach Attempts:  A second unsuccessful outreach was attempted today to offer the patient with information about available care coordination services as a benefit of their health plan.   HIPAA compliant message left on voicemail, providing contact information for CSW, encouraging patient to return CSW's call at his earliest convenience.  Follow Up Plan:  Additional outreach attempts will be made to offer the patient care coordination information and services.   Encounter Outcome:  No Answer.   Care Coordination Interventions Activated:  No.    Care Coordination Interventions:  No, not indicated.    Nat Christen, BSW, MSW, LCSW  Licensed Education officer, environmental Health System  Mailing [place] [address] [place], [place] [address] Physical Address[address], [place], [place] [address] Toll Free Main # [phone] Fax # [phone] Cell # [phone] [email or web address]

## 2022-04-15 ENCOUNTER — Inpatient Hospital Stay: Payer: BC Managed Care – PPO | Admitting: Family Medicine

## 2022-04-15 ENCOUNTER — Encounter: Payer: Self-pay | Admitting: Family Medicine

## 2022-04-16 ENCOUNTER — Telehealth: Payer: Self-pay | Admitting: *Deleted

## 2022-04-16 DIAGNOSIS — M86171 Other acute osteomyelitis, right ankle and foot: Secondary | ICD-10-CM | POA: Insufficient documentation

## 2022-04-16 DIAGNOSIS — M2041 Other hammer toe(s) (acquired), right foot: Secondary | ICD-10-CM | POA: Diagnosis not present

## 2022-04-16 NOTE — Patient Outreach (Signed)
  Care Coordination TOC Note Transition Care Management Unsuccessful Follow-up Telephone Call  Date of discharge and from where:  [facility] on 04/08/22  Attempts:  1st Attempt  Reason for unsuccessful TCM follow-up call:  No answer/busy. Voicemail has someone else's name on it and patient does not have anyone listed on DPR/HIPAA to contact. No other telephone numbers for patient.  Chong Sicilian, BSN, RN-BC RN Care Coordinator [place] Direct Dial: [phone] Main #: [phone]

## 2022-04-17 ENCOUNTER — Telehealth: Payer: Self-pay | Admitting: *Deleted

## 2022-04-17 ENCOUNTER — Other Ambulatory Visit: Payer: Self-pay | Admitting: *Deleted

## 2022-04-17 NOTE — Patient Outreach (Signed)
  Care Coordination [facility] Note Transition Care Management Unsuccessful Follow-up Telephone Call  Date of discharge and from where:  [facility] 04/08/22  Attempts:  2nd Attempt  Reason for unsuccessful TCM follow-up call:  No answer/busy  Chong Sicilian, BSN, RN-BC RN Care Coordinator [place]: [phone] Main #: [phone]

## 2022-04-17 NOTE — Patient Outreach (Signed)
  Care Coordination   04/17/2022  Name: Luis Ford MRN: [id number] DOB: 1961/01/08   Care Coordination Outreach Attempts:  A third unsuccessful outreach was attempted today to offer the patient with information about available care coordination services as a benefit of their health plan. HIPAA compliant message left on voicemail for patient, providing contact information for CSW, encouraging patient to return CSW's call at his earliest convenience.  Follow Up Plan:  No further outreach attempts will be made at this time. We have been unable to contact the patient to offer or enroll patient in care coordination services.  Encounter Outcome:  No Answer.   Care Coordination Interventions Activated:  No.    Care Coordination Interventions:  No, not indicated.    Nat Christen, BSW, MSW, LCSW  Licensed Education officer, environmental [facility] System  Mailing [place] [address], [place], [place] [address] Physical Address[address], [place], [place] [address] Toll Free Main # [phone] Fax # [phone] Cell # [phone] [email or web address]

## 2022-05-07 DIAGNOSIS — M86171 Other acute osteomyelitis, right ankle and foot: Secondary | ICD-10-CM | POA: Diagnosis not present

## 2022-05-07 DIAGNOSIS — M2041 Other hammer toe(s) (acquired), right foot: Secondary | ICD-10-CM | POA: Diagnosis not present

## 2022-05-28 DIAGNOSIS — H6122 Impacted cerumen, left ear: Secondary | ICD-10-CM | POA: Diagnosis not present

## 2022-05-28 DIAGNOSIS — T162XXA Foreign body in left ear, initial encounter: Secondary | ICD-10-CM | POA: Diagnosis not present

## 2022-05-28 DIAGNOSIS — I10 Essential (primary) hypertension: Secondary | ICD-10-CM | POA: Diagnosis not present

## 2022-05-28 DIAGNOSIS — Z7984 Long term (current) use of oral hypoglycemic drugs: Secondary | ICD-10-CM | POA: Diagnosis not present

## 2022-05-28 DIAGNOSIS — Z5329 Procedure and treatment not carried out because of patient's decision for other reasons: Secondary | ICD-10-CM | POA: Diagnosis not present

## 2022-05-28 DIAGNOSIS — Z880 Allergy status to penicillin: Secondary | ICD-10-CM | POA: Diagnosis not present

## 2022-05-28 DIAGNOSIS — Z79899 Other long term (current) drug therapy: Secondary | ICD-10-CM | POA: Diagnosis not present

## 2022-05-28 DIAGNOSIS — E119 Type 2 diabetes mellitus without complications: Secondary | ICD-10-CM | POA: Diagnosis not present

## 2022-09-15 IMAGING — DX DG KNEE 1-2V PORT*R*
2 series · 2 of 2 positions shown · non-contrast
Comparison: 09/12/2020

CLINICAL DATA: Postop from right knee arthroplasty.

EXAM:
PORTABLE RIGHT KNEE - 1-2 VIEW

[knee ap]
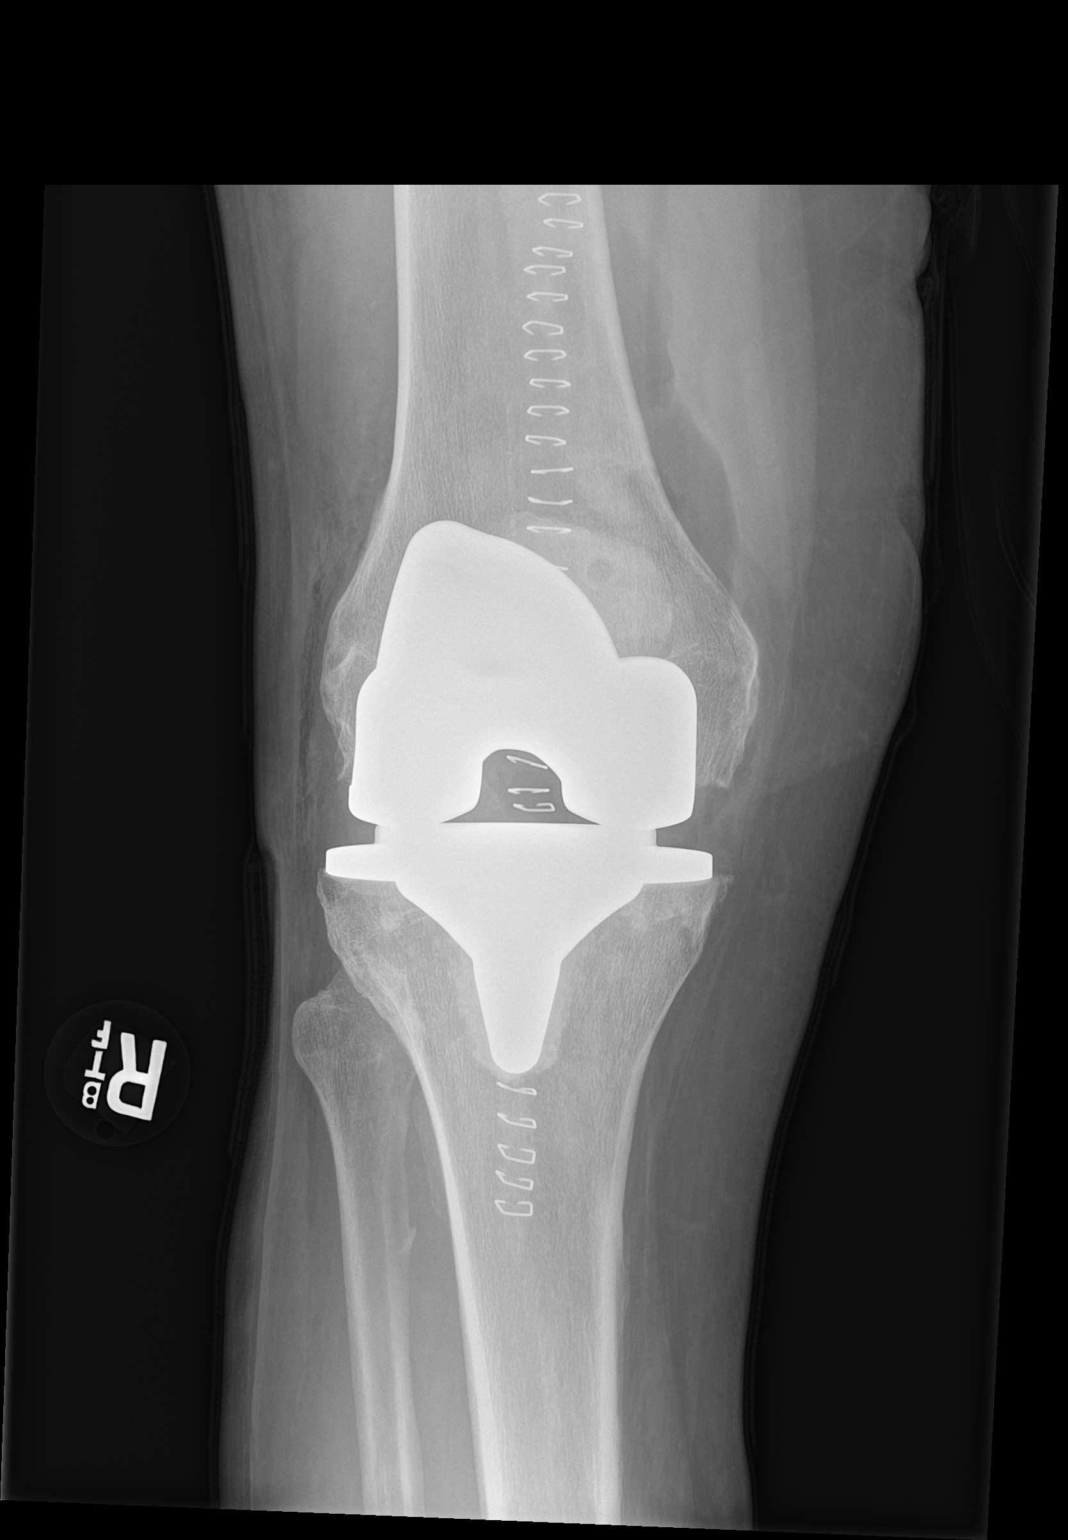

[knee lat]
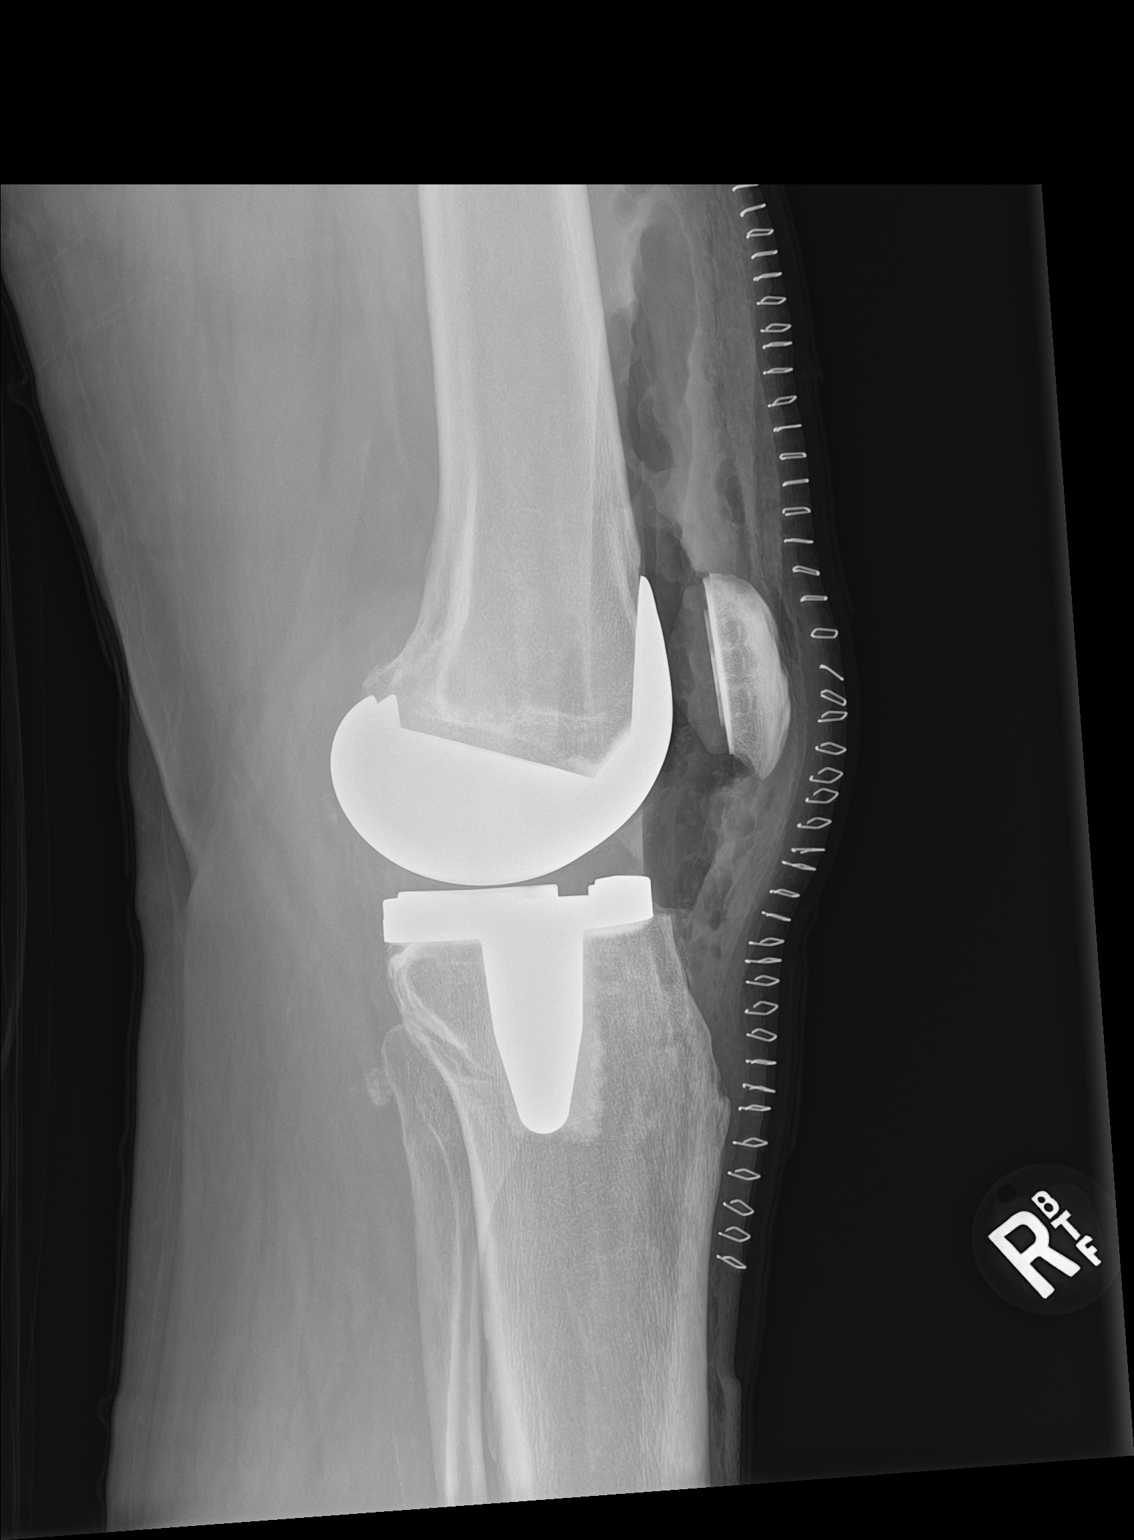

[2 of 2 positions shown; findings below may reference images not displayed]

FINDINGS: Total knee arthroplasty is seen with all 3 components in expected
position. No evidence of fracture or dislocation. Postop soft tissue
gas and overlying skin staples noted.
IMPRESSION: Expected postop appearance of right knee arthroplasty.

## 2022-09-18 ENCOUNTER — Encounter: Payer: Self-pay | Admitting: Radiology

## 2022-11-17 DIAGNOSIS — H40053 Ocular hypertension, bilateral: Secondary | ICD-10-CM | POA: Diagnosis not present

## 2022-11-17 LAB — HM DIABETES EYE EXAM

## 2023-05-18 DIAGNOSIS — Z23 Encounter for immunization: Secondary | ICD-10-CM | POA: Diagnosis not present

## 2023-11-25 ENCOUNTER — Telehealth: Payer: Self-pay

## 2023-11-25 NOTE — Telephone Encounter (Signed)
 Copied from CRM [id number]. Topic: Clinical - Prescription Issue >> Nov 25, 2023 11:38 AM [place] P wrote: Reason for CRM: PT is  a New pt and has new pt appt scheduled 07/09 and needs medication for blood pressure. Pt is wondering if anything can be done before his app.  Would like a callback on cell phone [phone]

## 2023-11-25 NOTE — Telephone Encounter (Signed)
 Copied from CRM [id number]. Topic: Appointments - Scheduling Inquiry for Clinic >> Nov 25, 2023 11:35 AM [place] P wrote: Reason for CRM: PT called in because he was recently seen by hosptial.   Pt is now a new pt and was scheduled with Sandra for 07/19 for New Pt Appt. Pt would like to be seen sooner. Was discharged from hospital 11/24/23 and needs blood pressure medication. Requesting follow up.    Pt callback [phone]

## 2023-11-26 NOTE — Telephone Encounter (Signed)
 Per other encounter pt was given sooner new pt appt

## 2023-12-02 ENCOUNTER — Ambulatory Visit (INDEPENDENT_AMBULATORY_CARE_PROVIDER_SITE_OTHER): Payer: Self-pay | Admitting: Nurse Practitioner

## 2023-12-02 ENCOUNTER — Encounter: Payer: Self-pay | Admitting: Nurse Practitioner

## 2023-12-02 VITALS — BP 198/113 | HR 89 | Temp 98.3°F | Ht 75.0 in | Wt 239.4 lb

## 2023-12-02 DIAGNOSIS — Z0001 Encounter for general adult medical examination with abnormal findings: Secondary | ICD-10-CM | POA: Insufficient documentation

## 2023-12-02 DIAGNOSIS — Z23 Encounter for immunization: Secondary | ICD-10-CM

## 2023-12-02 DIAGNOSIS — E782 Mixed hyperlipidemia: Secondary | ICD-10-CM | POA: Diagnosis not present

## 2023-12-02 DIAGNOSIS — I10 Essential (primary) hypertension: Secondary | ICD-10-CM

## 2023-12-02 DIAGNOSIS — Z7984 Long term (current) use of oral hypoglycemic drugs: Secondary | ICD-10-CM

## 2023-12-02 DIAGNOSIS — R351 Nocturia: Secondary | ICD-10-CM | POA: Diagnosis not present

## 2023-12-02 DIAGNOSIS — E1169 Type 2 diabetes mellitus with other specified complication: Secondary | ICD-10-CM | POA: Diagnosis not present

## 2023-12-02 DIAGNOSIS — Z125 Encounter for screening for malignant neoplasm of prostate: Secondary | ICD-10-CM

## 2023-12-02 LAB — BAYER DCA HB A1C WAIVED: HB A1C (BAYER DCA - WAIVED): 6 % — ABNORMAL HIGH (ref 4.8–5.6)

## 2023-12-02 MED ORDER — ROSUVASTATIN CALCIUM 40 MG PO TABS: 40.0000 mg | ORAL_TABLET | Freq: Every day | ORAL | 0 refills | Status: DC

## 2023-12-02 MED ORDER — AMLODIPINE BESYLATE 5 MG PO TABS: 5.0000 mg | ORAL_TABLET | Freq: Every day | ORAL | 0 refills | Status: DC

## 2023-12-02 MED ORDER — CLONIDINE HCL 0.1 MG PO TABS
0.2000 mg | ORAL_TABLET | Freq: Once | ORAL | Status: AC
  Administered 2023-12-02: 0.2 mg via ORAL

## 2023-12-02 MED ORDER — LISINOPRIL-HYDROCHLOROTHIAZIDE 20-12.5 MG PO TABS: 2.0000 | ORAL_TABLET | Freq: Every day | ORAL | 3 refills | Status: DC

## 2023-12-02 MED ORDER — METFORMIN HCL ER 500 MG PO TB24: 500.0000 mg | ORAL_TABLET | Freq: Two times a day (BID) | ORAL | 0 refills | Status: DC

## 2023-12-02 MED ORDER — ASPIRIN 81 MG PO CHEW: 81.0000 mg | CHEWABLE_TABLET | Freq: Every day | ORAL | 0 refills | Status: DC

## 2023-12-02 NOTE — Progress Notes (Signed)
 New Patient Office Visit  Subjective   Patient ID: Luis Ford, male    DOB: 1960-08-23  Age: 63 y.o. MRN: [id number]  CC:  Chief Complaint  Patient presents with   Establish Care   Dizziness    Has been having episodes of dizziness, has been out of b/p medication     HPI Luis Ford is a 63 year old male presenting on 12/02/2023 to establish primary care. He reports recently taking early retirement from his job at [employer] and has been without regular access to medications. He visited the emergency department last Tuesday for an abscessed tooth, where his blood pressure was noted to be elevated at 176/92. He was advised to establish care with a primary provider.  Nocturia The patient also reports nocturia, stating he wakes up 3-4 times per night to urinate. A PSA test will be ordered to evaluate potential underlying causes. Further assessment and blood pressure monitoring are warranted. Hypertension, follow-up  BP Readings from Last 3 Encounters:  12/02/23 (!) 198/113  10/24/20 (!) 148/97  10/10/20 125/80   Wt Readings from Last 3 Encounters:  12/02/23 239 lb 6.4 oz (108.6 kg)  11/22/20 238 lb (108 kg)  10/09/20 242 lb 8.1 oz (110 kg)     He has a dx of HTN and has not been taking is medications since he ran out of it after his plan was closed. Since his PCP was at is job. He went to ED to tooth abscess and BP was elevated. No medication was prescribed, was told he needs to establish PCP He reports poor compliance with treatment. He is not having side effects.  He is following a  diet. He  exercising. He  smoke. Use of agents associated with hypertension: .  Outside blood pressures are not being monitored Symptoms: No chest pain No chest pressure  No palpitations No syncope  No dyspnea No orthopnea  No paroxysmal nocturnal dyspnea No lower extremity edema   Pertinent labs Lab Results  Component Value Date   CHOL 169 09/14/2020   HDL 33 (L) 09/14/2020    LDLCALC 105 (H) 09/14/2020   TRIG 173 (H) 09/14/2020   CHOLHDL 5.1 (H) 09/14/2020   Lab Results  Component Value Date   NA 134 (L) 10/10/2020   K 4.0 10/10/2020   CREATININE 1.37 (H) 10/10/2020   GFRNONAA 59 (L) 10/10/2020   GLUCOSE 110 (H) 10/10/2020     The ASCVD Risk score (Arnett DK, et al., 2019) failed to calculate for the following reasons:   Cannot find a previous HDL lab   Cannot find a previous total cholesterol lab  Lipid/Cholesterol, Follow-up  Last lipid panel Other pertinent labs  Lab Results  Component Value Date   CHOL 169 09/14/2020   HDL 33 (L) 09/14/2020   LDLCALC 105 (H) 09/14/2020   TRIG 173 (H) 09/14/2020   CHOLHDL 5.1 (H) 09/14/2020   Lab Results  Component Value Date   ALT 26 09/14/2020   AST 21 09/14/2020   PLT 263 10/10/2020     He has a dx of hyperlipidemia was being managed  Crestor  40 mg daily. not been taking his medications since he ran out of it after his plan was closed.  He reports  compliance with treatment. He is not having side effects.  Symptoms: No chest pain No chest pressure/discomfort  No dyspnea No lower extremity edema  No numbness or tingling of extremity No orthopnea  No palpitations No paroxysmal nocturnal dyspnea  No speech  difficulty No syncope   Current diet: in general, an "unhealthy" diet Current exercise: none  The ASCVD Risk score (Arnett DK, et al., 2019) failed to calculate for the following reasons:   Cannot find a previous HDL lab   Cannot find a previous total cholesterol lab  Diabetes Mellitus Type II  Lab Results  Component Value Date   HGBA1C 6.0 (H) 12/02/2023   HGBA1C 6.6 09/14/2020   Wt Readings from Last 3 Encounters:  12/02/23 239 lb 6.4 oz (108.6 kg)  11/22/20 238 lb (108 kg)  10/09/20 242 lb 8.1 oz (110 kg)   He has a dx of DM and was being managed with metformin  500 mg daily Has been on med since his job closed 43-months ago He reports poor compliance with treatment. He is not having  side effects.  Symptoms: No fatigue No foot ulcerations  No appetite changes No nausea  No paresthesia of the feet  No polydipsia  No polyuria No visual disturbances   No vomiting     Home blood sugar records: not being measured  Episodes of hypoglycemia? No Most Recent Eye Exam: need to make appointment Current exercise: none Current diet habits: in general, an "unhealthy" diet  Pertinent Labs: Lab Results  Component Value Date   CHOL 169 09/14/2020   HDL 33 (L) 09/14/2020   LDLCALC 105 (H) 09/14/2020   TRIG 173 (H) 09/14/2020   CHOLHDL 5.1 (H) 09/14/2020   Lab Results  Component Value Date   NA 134 (L) 10/10/2020   K 4.0 10/10/2020   CREATININE 1.37 (H) 10/10/2020   GFRNONAA 59 (L) 10/10/2020       Outpatient Encounter Medications as of 12/02/2023  Medication Sig   amLODipine  (NORVASC ) 5 MG tablet Take 1 tablet (5 mg total) by mouth daily.   clindamycin (CLEOCIN) 300 MG capsule Take 300 mg by mouth.   [DISCONTINUED] ketorolac  (TORADOL ) 10 MG tablet Take 10 mg by mouth every 6 (six) hours as needed.   aspirin  81 MG chewable tablet Chew 1 tablet (81 mg total) by mouth daily.   b complex vitamins capsule Take 1 capsule by mouth daily. (Patient not taking: Reported on 12/02/2023)   lisinopril -hydrochlorothiazide  (ZESTORETIC ) 20-12.5 MG tablet Take 2 tablets by mouth daily.   metFORMIN  (GLUCOPHAGE -XR) 500 MG 24 hr tablet Take 1 tablet (500 mg total) by mouth 2 (two) times daily with a meal.   rosuvastatin  (CRESTOR ) 40 MG tablet Take 1 tablet (40 mg total) by mouth daily.   [DISCONTINUED] aspirin  81 MG chewable tablet Chew 1 tablet (81 mg total) by mouth 2 (two) times daily. (Patient not taking: Reported on 12/02/2023)   [DISCONTINUED] celecoxib  (CELEBREX ) 200 MG capsule Take 1 capsule (200 mg total) by mouth 2 (two) times daily. (Patient not taking: Reported on 12/02/2023)   [DISCONTINUED] diclofenac  Sodium (VOLTAREN ) 1 % GEL APPLY A SMALL AMOUNT TOPICALLY THREE TIMES DAILY  (Patient not taking: Reported on 12/02/2023)   [DISCONTINUED] docusate sodium  (COLACE) 100 MG capsule Take 1 capsule (100 mg total) by mouth 2 (two) times daily. (Patient not taking: Reported on 12/02/2023)   [DISCONTINUED] escitalopram  (LEXAPRO ) 10 MG tablet Take 1 tablet (10 mg total) by mouth daily. (Patient not taking: Reported on 12/02/2023)   [DISCONTINUED] lisinopril -hydrochlorothiazide  (ZESTORETIC ) 20-12.5 MG tablet Take 2 tablets by mouth daily. (Patient not taking: Reported on 12/02/2023)   [DISCONTINUED] metFORMIN  (GLUCOPHAGE -XR) 500 MG 24 hr tablet Take 500 mg by mouth daily before supper. (Patient not taking: Reported on 12/02/2023)   [DISCONTINUED]  omeprazole  (PRILOSEC) 20 MG capsule Take 1 capsule (20 mg total) by mouth daily. (Patient not taking: Reported on 12/02/2023)   [DISCONTINUED] rosuvastatin  (CRESTOR ) 40 MG tablet Take 1 tablet (40 mg total) by mouth daily. (Patient not taking: Reported on 12/02/2023)   [DISCONTINUED] sildenafil  (REVATIO ) 20 MG tablet Take 1-3 tablets (20-60 mg total) by mouth as needed. (Patient not taking: Reported on 12/02/2023)   [DISCONTINUED] traMADol  (ULTRAM ) 50 MG tablet Take 1 tablet (50 mg total) by mouth every 6 (six) hours. (Patient not taking: Reported on 12/02/2023)   [EXPIRED] cloNIDine (CATAPRES) tablet 0.2 mg    No facility-administered encounter medications on file as of 12/02/2023.    Past Medical History:  Diagnosis Date   Asthma    Complication of anesthesia    patient states he can only have spinal anesthesia.     Complication of anesthesia    Patient stopped breathing after geneal anesthesia.   Diabetes mellitus without complication (HCC)    Hyperlipemia    Hypertension    MRSA infection    Sleep apnea    Toe amputee [facility])     Past Surgical History:  Procedure Laterality Date   APPENDECTOMY     COLONOSCOPY  2003   [facility]-normal   COLONOSCOPY  04/15/2012   Procedure: COLONOSCOPY;  Surgeon: Suzette Espy, MD;  Location[facility];  Service: Endoscopy;  Laterality: N/A;  8:45   HERNIA REPAIR     right inguinal   TOE AMPUTATION     TOTAL KNEE ARTHROPLASTY Right 10/09/2020   Procedure: RIGHT TOTAL KNEE ARTHROPLASTY;  Surgeon: Darrin Emerald, MD;  Location: [facility] OR[facility];  Service: Orthopedics;  Laterality: Right;   TRACHEOSTOMY     infant/closure as child   TRACHEOSTOMY     pt was 88 days old when he had a trach placed.     Family History  Problem Relation Age of Onset   Coronary artery disease Mother    Colon cancer Brother 66    Social History   Socioeconomic History   Marital status: Single    Spouse name: Not on file   Number of children: 2   Years of education: Not on file   Highest education level: Not on file  Occupational History   Occupation: Tour manager: [employer]  Tobacco Use   Smoking status: Never   Smokeless tobacco: Never  Vaping Use   Vaping status: Never Used  Substance and Sexual Activity   Alcohol use: No   Drug use: No   Sexual activity: Not on file  Other Topics Concern   Not on file  Social History Narrative   2 healthy sons    Lives w/ girlfriend   Social Drivers of Corporate investment banker Strain: Low Risk  (04/04/2022)   [facility] from [facility], [facility]   Overall Financial Resource Strain (CARDIA)    Difficulty of Paying Living Expenses: Not hard at all  Food Insecurity: No Food Insecurity (04/04/2022)   Received from [facility], [facility]   Hunger Vital Sign    Worried About Running Out of Food in the Last Year: Never true    Ran Out of Food in the Last Year: Never true  Transportation Needs: No Transportation Needs (04/04/2022)   Received from [facility], [facility]   PRAPARE - Transportation    Lack of Transportation (Medical): No    Lack of Transportation (Non-Medical): No  Physical  Activity: Not on file  Stress: Not on file  Social Connections: Not on file  Intimate Partner Violence: Not on file     Review of Systems  Constitutional:  Negative for chills and fever.  Respiratory:  Negative for cough, shortness of breath and wheezing.   Cardiovascular:  Negative for chest pain and leg swelling.  Gastrointestinal:  Negative for abdominal pain, diarrhea, heartburn and nausea.  Genitourinary:        Nocturia  Musculoskeletal:  Negative for falls.  Skin:  Negative for itching and rash.  Neurological:  Negative for dizziness.  Psychiatric/Behavioral:  Negative for depression and suicidal ideas. The patient does not have insomnia.    Negative unless indicated in HPI    Objective   BP (!) 198/113   Pulse 89   Temp 98.3 F (36.8 C) (Temporal)   Ht 6\' 3"  (1.905 m)   Wt 239 lb 6.4 oz (108.6 kg)   SpO2 100%   BMI 29.92 kg/m   Physical Exam Vitals and nursing note reviewed.  Constitutional:      General: He is not in acute distress.    Appearance: Normal appearance.  HENT:     Head: Normocephalic and atraumatic.     Right Ear: Tympanic membrane, ear canal and external ear normal. There is no impacted cerumen.     Left Ear: Tympanic membrane, ear canal and external ear normal. There is no impacted cerumen.     Nose: Nose normal.     Mouth/Throat:     Mouth: Mucous membranes are moist.  Eyes:     General: No scleral icterus.    Extraocular Movements: Extraocular movements intact.     Conjunctiva/sclera: Conjunctivae normal.     Pupils: Pupils are equal, round, and reactive to light.  Cardiovascular:     Heart sounds: Normal heart sounds.  Pulmonary:     Effort: Pulmonary effort is normal.     Breath sounds: Normal breath sounds.  Musculoskeletal:        General: Normal range of motion.     Right lower leg: No edema.     Left lower leg: No edema.  Skin:    General: Skin is warm and dry.     Findings: No rash.  Neurological:     Mental Status: He is alert and oriented to person, place, and time.  Psychiatric:        Mood and Affect: Mood normal.        Behavior:  Behavior normal.        Thought Content: Thought content normal.        Judgment: Judgment normal.     Last CBC Lab Results  Component Value Date   WBC 6.1 10/10/2020   HGB 12.7 (L) 10/10/2020   HCT 40.5 10/10/2020   MCV 94.0 10/10/2020   MCH 29.5 10/10/2020   RDW 13.8 10/10/2020   PLT 263 10/10/2020   Last metabolic panel Lab Results  Component Value Date   GLUCOSE 110 (H) 10/10/2020   NA 134 (L) 10/10/2020   K 4.0 10/10/2020   CL 98 10/10/2020   CO2 27 10/10/2020   BUN 20 10/10/2020   CREATININE 1.37 (H) 10/10/2020   GFRNONAA 59 (L) 10/10/2020   CALCIUM  8.9 10/10/2020   PROT 7.1 09/14/2020   ALBUMIN 4.8 09/14/2020   LABGLOB 2.3 09/14/2020   AGRATIO 2.1 09/14/2020   BILITOT 0.4 09/14/2020   ALKPHOS 80 09/14/2020   AST 21 09/14/2020   ALT 26 09/14/2020  ANIONGAP 9 10/10/2020   Last lipids Lab Results  Component Value Date   CHOL 169 09/14/2020   HDL 33 (L) 09/14/2020   LDLCALC 105 (H) 09/14/2020   TRIG 173 (H) 09/14/2020   CHOLHDL 5.1 (H) 09/14/2020   Last hemoglobin A1c Lab Results  Component Value Date   HGBA1C 6.0 (H) 12/02/2023      Assessment & Plan:  Essential hypertension -     CBC with Differential/Platelet -     Comprehensive metabolic panel with GFR -     Lisinopril -hydroCHLOROthiazide ; Take 2 tablets by mouth daily.  Dispense: 180 tablet; Refill: 3 -     amLODIPine  Besylate; Take 1 tablet (5 mg total) by mouth daily.  Dispense: 90 tablet; Refill: 0 -     cloNIDine HCl  Nocturia -     PSA, total and free  Mixed hyperlipidemia -     Thyroid  Profile -     Lipid panel -     Rosuvastatin  Calcium ; Take 1 tablet (40 mg total) by mouth daily.  Dispense: 90 tablet; Refill: 0  Encounter for general adult medical examination with abnormal findings -     CBC with Differential/Platelet -     Comprehensive metabolic panel with GFR -     Thyroid  Profile -     Lipid panel -     PSA, total and free  Type 2 diabetes mellitus with other specified  complication, without long-term current use of insulin  (HCC) -     Bayer DCA Hb A1c Waived -     Microalbumin / creatinine urine ratio  Screening PSA (prostate specific antigen) -     PSA, total and free  Immunization due -     Tdap vaccine greater than or equal to 7yo IM  Other orders -     metFORMIN  HCl ER; Take 1 tablet (500 mg total) by mouth 2 (two) times daily with a meal.  Dispense: 180 tablet; Refill: 0 -     Aspirin ; Chew 1 tablet (81 mg total) by mouth daily.  Dispense: 90 tablet; Refill: 0   HTN: BP 198/113 Clonidine 0.02 mg administered, BP post medication administration 202/118 Follow up 1-week nurse visit fro BP check BP not controlled. Changes restart Zestoretic  20-12.5 mg daily. Goal BP is 130/80. Pt aware to report any persistent high or low readings. DASH diet and exercise encouraged. Exercise at least 150 minutes per week and increase as tolerated. Goal BMI > 25. Stress management encouraged. Avoid nicotine and tobacco product use. Avoid excessive alcohol and NSAID's. Avoid more than 2000 mg of sodium daily. Medications as prescribed. Follow up as scheduled.    Hyperlipidemia: restart Crestor  40 mg daily, lipid pending  Diabetes: restart metformin  500 mg Bid, A1 6.0 %, micro, diabetic foot exam  Nocturia: PSA results pending  Labs: CBC, CMP, Lipid , TSH, PSA, results pending  Health maintenace: T-Dap administered Encourage healthy lifestyle choices, including diet (rich in fruits, vegetables, and lean proteins, and low in salt and simple carbohydrates) and exercise (at least 30 minutes of moderate physical activity daily).     The above assessment and management plan was discussed with the patient. The patient verbalized understanding of and has agreed to the management plan. Patient is aware to call the clinic if they develop any new symptoms or if symptoms persist or worsen. Patient is aware when to return to the clinic for a follow-up visit. Patient educated on  when it is appropriate to go to the emergency  department.  Return in about 2 weeks (around 12/16/2023).   Khilee Hendricksen [place] Thompson, DNP [facility] [address] [place], [place] [address] [phone]  Note: This document was prepared by [employer] voice dictation technology and any errors that results from this process are unintentional.

## 2023-12-03 LAB — COMPREHENSIVE METABOLIC PANEL WITH GFR
ALT: 20 IU/L (ref 0–44)
AST: 18 IU/L (ref 0–40)
Albumin: 4.5 g/dL (ref 3.9–4.9)
Alkaline Phosphatase: 97 IU/L (ref 44–121)
BUN/Creatinine Ratio: 17 (ref 10–24)
BUN: 20 mg/dL (ref 8–27)
Bilirubin Total: 0.3 mg/dL (ref 0.0–1.2)
CO2: 23 mmol/L (ref 20–29)
Calcium: 9.5 mg/dL (ref 8.6–10.2)
Chloride: 100 mmol/L (ref 96–106)
Creatinine, Ser: 1.19 mg/dL (ref 0.76–1.27)
Globulin, Total: 2.5 g/dL (ref 1.5–4.5)
Glucose: 107 mg/dL — ABNORMAL HIGH (ref 70–99)
Potassium: 5.1 mmol/L (ref 3.5–5.2)
Sodium: 139 mmol/L (ref 134–144)
Total Protein: 7 g/dL (ref 6.0–8.5)
eGFR: 69 mL/min/{1.73_m2} (ref >59)

## 2023-12-03 LAB — THYROID PANEL
Free Thyroxine Index: 1.7 (ref 1.2–4.9)
T3 Uptake Ratio: 23 % — ABNORMAL LOW (ref 24–39)
T4, Total: 7.5 ug/dL (ref 4.5–12.0)

## 2023-12-03 LAB — CBC WITH DIFFERENTIAL/PLATELET
Basophils Absolute: 0.1 10*3/uL (ref 0.0–0.2)
Basos: 1 %
EOS (ABSOLUTE): 0.1 10*3/uL (ref 0.0–0.4)
Eos: 1 %
Hematocrit: 48.8 % (ref 37.5–51.0)
Hemoglobin: 16.3 g/dL (ref 13.0–17.7)
Immature Grans (Abs): 0.1 10*3/uL (ref 0.0–0.1)
Immature Granulocytes: 1 %
Lymphocytes Absolute: 1.5 10*3/uL (ref 0.7–3.1)
Lymphs: 17 %
MCH: 29.2 pg (ref 26.6–33.0)
MCHC: 33.4 g/dL (ref 31.5–35.7)
MCV: 88 fL (ref 79–97)
Monocytes Absolute: 0.5 10*3/uL (ref 0.1–0.9)
Monocytes: 6 %
Neutrophils Absolute: 6.5 10*3/uL (ref 1.4–7.0)
Neutrophils: 74 %
Platelets: 307 10*3/uL (ref 150–450)
RBC: 5.58 x10E6/uL (ref 4.14–5.80)
RDW: 13.1 % (ref 11.6–15.4)
WBC: 8.7 10*3/uL (ref 3.4–10.8)

## 2023-12-03 LAB — PSA, TOTAL AND FREE
PSA, Free Pct: 26 %
PSA, Free: 0.13 ng/mL
Prostate Specific Ag, Serum: 0.5 ng/mL (ref 0.0–4.0)

## 2023-12-03 LAB — LIPID PANEL
Chol/HDL Ratio: 8.6 ratio — ABNORMAL HIGH (ref 0.0–5.0)
Cholesterol, Total: 293 mg/dL — ABNORMAL HIGH (ref 100–199)
HDL: 34 mg/dL — ABNORMAL LOW (ref >39)
LDL Chol Calc (NIH): 205 mg/dL — ABNORMAL HIGH (ref 0–99)
Triglycerides: 271 mg/dL — ABNORMAL HIGH (ref 0–149)
VLDL Cholesterol Cal: 54 mg/dL — ABNORMAL HIGH (ref 5–40)

## 2023-12-03 LAB — MICROALBUMIN / CREATININE URINE RATIO
Creatinine, Urine: 100.3 mg/dL
Microalb/Creat Ratio: 12 mg/g{creat} (ref 0–29)
Microalbumin, Urine: 12.5 ug/mL

## 2023-12-08 ENCOUNTER — Ambulatory Visit: Payer: Self-pay | Admitting: Nurse Practitioner

## 2023-12-08 ENCOUNTER — Other Ambulatory Visit: Payer: Self-pay | Admitting: Nurse Practitioner

## 2023-12-08 DIAGNOSIS — E782 Mixed hyperlipidemia: Secondary | ICD-10-CM

## 2023-12-08 MED ORDER — EZETIMIBE 10 MG PO TABS: 10.0000 mg | ORAL_TABLET | Freq: Every day | ORAL | 0 refills | Status: DC

## 2023-12-15 NOTE — Progress Notes (Unsigned)
 Established Patient Office Visit  Subjective  Patient ID: Luis Ford, male    DOB: [birth year]  Age: 63 y.o. MRN: [id number]  No chief complaint on file.   HPI [name]  Hypertension, follow-up  BP Readings from Last 3 Encounters:  12/02/23 (!) 198/113  10/24/20 (!) 148/97  10/10/20 125/80   Wt Readings from Last 3 Encounters:  12/02/23 239 lb 6.4 oz (108.6 kg)  11/22/20 238 lb (108 kg)  10/09/20 242 lb 8.1 oz (110 kg)     He was last seen for hypertension {NUMBERS 1-12:18279} {days/wks/mos/yrs:310907} ago.  BP at that visit was ***. Management since that visit includes ***.  He reports {excellent/good/fair/poor:19665} compliance with treatment. He {is/is not:9024} having side effects. {document side effects if present:1} He is following a {diet:21022986} diet. He {is/is not:9024} exercising. He {does/does not:200015} smoke.  Use of agents associated with hypertension: {bp agents assoc with hypertension:511::"none"}.   Outside blood pressures are {***enter patient reported home BP readings, or 'not being checked':1}. Symptoms: {Yes/No:20286} chest pain {Yes/No:20286} chest pressure  {Yes/No:20286} palpitations {Yes/No:20286} syncope  {Yes/No:20286} dyspnea {Yes/No:20286} orthopnea  {Yes/No:20286} paroxysmal nocturnal dyspnea {Yes/No:20286} lower extremity edema   Pertinent labs Lab Results  Component Value Date   CHOL 293 (H) 12/02/2023   HDL 34 (L) 12/02/2023   LDLCALC 205 (H) 12/02/2023   TRIG 271 (H) 12/02/2023   CHOLHDL 8.6 (H) 12/02/2023   Lab Results  Component Value Date   NA 139 12/02/2023   K 5.1 12/02/2023   CREATININE 1.19 12/02/2023   EGFR 69 12/02/2023   GLUCOSE 107 (H) 12/02/2023     The 10-year ASCVD risk score (Arnett DK, et al., 2019) is: 61.7%  ---------------------------------------------------------------------------------------------------  Patient Active Problem List   Diagnosis Date Noted   Encounter for general adult  medical examination with abnormal findings 12/02/2023   Acute osteomyelitis of toe of right foot (HCC) 04/16/2022   S/P total knee replacement, right 10/09/20  10/22/2020   Osteoarthritis of right knee 10/09/2020   Primary osteoarthritis of right knee    Type 2 diabetes mellitus with other specified complication (HCC) 04/15/2019   Diabetic foot ulcer (HCC) 04/15/2019   Anxiety 07/04/2016   GERD (gastroesophageal reflux disease) 07/04/2016   Hyperlipidemia 04/14/2016   Essential hypertension 02/20/2015   Family history of cancer 03/18/2012   Screening for colon cancer 03/18/2012   Past Medical History:  Diagnosis Date   Asthma    Complication of anesthesia    patient states he can only have spinal anesthesia.     Complication of anesthesia    Patient stopped breathing after geneal anesthesia.   Diabetes mellitus without complication (HCC)    Hyperlipemia    Hypertension    MRSA infection    Sleep apnea    Toe amputee [facility])    Past Surgical History:  Procedure Laterality Date   APPENDECTOMY     COLONOSCOPY  2003   [facility]-normal   COLONOSCOPY  04/15/2012   Procedure: COLONOSCOPY;  Surgeon: Suzette Espy, MD;  Location[facility];  Service: Endoscopy;  Laterality: N/A;  8:45   HERNIA REPAIR     right inguinal   TOE AMPUTATION     TOTAL KNEE ARTHROPLASTY Right 10/09/2020   Procedure: RIGHT TOTAL KNEE ARTHROPLASTY;  Surgeon: Darrin Emerald, MD;  Location: [facility];  Service: Orthopedics;  Laterality: Right;   TRACHEOSTOMY     infant/closure as child   TRACHEOSTOMY     pt was 77 days old when  he had a trach placed.    Social History   Tobacco Use   Smoking status: Never   Smokeless tobacco: Never  Vaping Use   Vaping status: Never Used  Substance Use Topics   Alcohol use: No   Drug use: No   Social History   Socioeconomic History   Marital status: Single    Spouse name: Not on file   Number of children: 2   Years of education: Not on file   Highest  education level: Not on file  Occupational History   Occupation: Tour manager: [employer]  Tobacco Use   Smoking status: Never   Smokeless tobacco: Never  Vaping Use   Vaping status: Never Used  Substance and Sexual Activity   Alcohol use: No   Drug use: No   Sexual activity: Not on file  Other Topics Concern   Not on file  Social History Narrative   2 healthy sons    Lives w/ girlfriend   Social Drivers of Corporate investment banker Strain: Low Risk  (04/04/2022)   Received from [facility], [facility]   Overall Financial Resource Strain (CARDIA)    Difficulty of Paying Living Expenses: Not hard at all  Food Insecurity: No Food Insecurity (04/04/2022)   Received from [facility], [facility]   Hunger Vital Sign    Worried About Running Out of Food in the Last Year: Never true    Ran Out of Food in the Last Year: Never true  Transportation Needs: No Transportation Needs (04/04/2022)   Received from [facility], [facility]   PRAPARE - Transportation    Lack of Transportation (Medical): No    Lack of Transportation (Non-Medical): No  Physical Activity: Not on file  Stress: Not on file  Social Connections: Not on file  Intimate Partner Violence: Not on file   Family Status  Relation Name Status   Mother  Deceased   Father  Deceased       MVA   Brother  Alive   Brother  Alive   Sister  Alive   Brother  (Not Specified)  No partnership data on file   Family History  Problem Relation Age of Onset   Coronary artery disease Mother    Colon cancer Brother 11   Allergies  Allergen Reactions   Penicillins Other (See Comments)    Childhood illness       ROS Negative unless indicated in HPI   Objective:     There were no vitals taken for this visit. BP Readings from Last 3 Encounters:  12/02/23 (!) 198/113  10/24/20 (!) 148/97  10/10/20 125/80   Wt Readings from Last 3 Encounters:  12/02/23 239 lb 6.4 oz (108.6 kg)   11/22/20 238 lb (108 kg)  10/09/20 242 lb 8.1 oz (110 kg)      Physical Exam   No results found for any visits on 12/16/23.  Last CBC Lab Results  Component Value Date   WBC 8.7 12/02/2023   HGB 16.3 12/02/2023   HCT 48.8 12/02/2023   MCV 88 12/02/2023   MCH 29.2 12/02/2023   RDW 13.1 12/02/2023   PLT 307 12/02/2023   Last metabolic panel Lab Results  Component Value Date   GLUCOSE 107 (H) 12/02/2023   NA 139 12/02/2023   K 5.1 12/02/2023   CL 100 12/02/2023   CO2 23 12/02/2023   BUN 20 12/02/2023  CREATININE 1.19 12/02/2023   EGFR 69 12/02/2023   CALCIUM  9.5 12/02/2023   PROT 7.0 12/02/2023   ALBUMIN 4.5 12/02/2023   LABGLOB 2.5 12/02/2023   AGRATIO 2.1 09/14/2020   BILITOT 0.3 12/02/2023   ALKPHOS 97 12/02/2023   AST 18 12/02/2023   ALT 20 12/02/2023   ANIONGAP 9 10/10/2020   Last lipids Lab Results  Component Value Date   CHOL 293 (H) 12/02/2023   HDL 34 (L) 12/02/2023   LDLCALC 205 (H) 12/02/2023   TRIG 271 (H) 12/02/2023   CHOLHDL 8.6 (H) 12/02/2023   Last hemoglobin A1c Lab Results  Component Value Date   HGBA1C 6.0 (H) 12/02/2023   Last thyroid  functions Lab Results  Component Value Date   T4TOTAL 7.5 12/02/2023        Assessment & Plan:  There are no diagnoses linked to this encounter. Continue healthy lifestyle choices, including diet (rich in fruits, vegetables, and lean proteins, and low in salt and simple carbohydrates) and exercise (at least 30 minutes of moderate physical activity daily).     The above assessment and management plan was discussed with the patient. The patient verbalized understanding of and has agreed to the management plan. Patient is aware to call the clinic if they develop any new symptoms or if symptoms persist or worsen. Patient is aware when to return to the clinic for a follow-up visit. Patient educated on when it is appropriate to go to the emergency department.  No follow-ups on file.    Carliss Porcaro [place]  [place] Thompson, DNP [facility] [address] [place], [place] [address] [phone]    Note: This document was prepared by [employer] voice dictation technology and any errors that results from this process are unintentional.

## 2023-12-16 ENCOUNTER — Ambulatory Visit (INDEPENDENT_AMBULATORY_CARE_PROVIDER_SITE_OTHER): Admitting: Nurse Practitioner

## 2023-12-16 ENCOUNTER — Encounter: Payer: Self-pay | Admitting: Nurse Practitioner

## 2023-12-16 VITALS — BP 128/74 | HR 106 | Temp 97.4°F | Ht 75.0 in | Wt 234.8 lb

## 2023-12-16 DIAGNOSIS — I10 Essential (primary) hypertension: Secondary | ICD-10-CM | POA: Diagnosis not present

## 2023-12-16 MED ORDER — LISINOPRIL-HYDROCHLOROTHIAZIDE 20-12.5 MG PO TABS: 1.0000 | ORAL_TABLET | Freq: Every day | ORAL | 0 refills | Status: DC

## 2023-12-21 ENCOUNTER — Ambulatory Visit (INDEPENDENT_AMBULATORY_CARE_PROVIDER_SITE_OTHER)

## 2023-12-21 DIAGNOSIS — Z7984 Long term (current) use of oral hypoglycemic drugs: Secondary | ICD-10-CM

## 2023-12-21 DIAGNOSIS — E1169 Type 2 diabetes mellitus with other specified complication: Secondary | ICD-10-CM | POA: Diagnosis not present

## 2023-12-21 LAB — HM DIABETES EYE EXAM

## 2023-12-21 NOTE — Progress Notes (Signed)
 Luis Ford arrived 12/21/2023 and has given verbal consent to obtain images and complete their overdue diabetic retinal screening.  The images have been sent to an ophthalmologist or optometrist for review and interpretation.  Results will be sent back to [facility], Adell Hones, NP for review.  Patient has been informed they will be contacted when we receive the results via telephone or MyChart

## 2023-12-28 ENCOUNTER — Encounter: Payer: Self-pay | Admitting: Nurse Practitioner

## 2024-01-07 ENCOUNTER — Ambulatory Visit: Payer: Self-pay | Admitting: Nurse Practitioner

## 2024-01-15 ENCOUNTER — Ambulatory Visit: Payer: Self-pay

## 2024-01-15 NOTE — Telephone Encounter (Signed)
    FYI Only or Action Required?: FYI only for provider.  Patient was last seen in primary care on 12/16/2023 by [name], NP. Called Nurse Triage reporting Shortness of Breath. Symptoms began about a month ago. Interventions attempted: Nothing. Symptoms are: unchanged.  Triage Disposition: See PCP When Office is Open (Within 3 Days) apt Monday  Patient/caregiver understands and will follow disposition?: YesCopied from CRM #[id number]. Topic: Clinical - Red Word Triage >> Jan 15, 2024 10:17 AM [name] wrote: [facility] that prompted transfer to Nurse Triage: patient stated when he get up in the mornings he is out of breath and then when he stand up he gets dizzy, hurting in his chest, feeling passing out and moving slow. This has been going on for about 1 month Reason for Disposition  [1] MODERATE longstanding difficulty breathing (e.g., speaks in phrases, SOB even at rest, pulse 100-120) AND [2] SAME as normal  Answer Assessment - Initial Assessment Questions 1. RESPIRATORY STATUS: Describe your breathing? (e.g., wheezing, shortness of breath, unable to speak, severe coughing)      Shortness of breath, states start hurting in chest when he is walking 2. ONSET: When did this breathing problem begin?      A month ago 3. PATTERN Does the difficult breathing come and go, or has it been constant since it started?      Comes and goes, only when he is doing something 4. SEVERITY: How bad is your breathing? (e.g., mild, moderate, severe)    - MILD: No SOB at rest, mild SOB with walking, speaks normally in sentences, can lie down, no retractions, pulse < 100.    - MODERATE: SOB at rest, SOB with minimal exertion and prefers to sit, cannot lie down flat, speaks in phrases, mild retractions, audible wheezing, pulse 100-120.    - SEVERE: Very SOB at rest, speaks in single words, struggling to breathe, sitting hunched forward, retractions, pulse > 120      severe 5. RECURRENT  SYMPTOM: Have you had difficulty breathing before? If Yes, ask: When was the last time? and What happened that time?      No, not really 6. CARDIAC HISTORY: Do you have any history of heart disease? (e.g., heart attack, angina, bypass surgery, angioplasty)      no 7. LUNG HISTORY: Do you have any history of lung disease?  (e.g., pulmonary embolus, asthma, emphysema)     no 8. CAUSE: What do you think is causing the breathing problem?      no 9. OTHER SYMPTOMS: Do you have any other symptoms? (e.g., dizziness, runny nose, cough, chest pain, fever)     Chest discomfort 10. O2 SATURATION MONITOR:  Do you use an oxygen saturation monitor (pulse oximeter) at home? If Yes, ask: What is your reading (oxygen level) today? What is your usual oxygen saturation reading? (e.g., 95%)       na 11. PREGNANCY: Is there any chance you are pregnant? When was your last menstrual period?       na 12. TRAVEL: Have you traveled out of the country in the last month? (e.g., travel history, exposures)       no  Protocols used: Breathing Difficulty-A-AH

## 2024-01-18 ENCOUNTER — Other Ambulatory Visit

## 2024-01-18 ENCOUNTER — Encounter: Payer: Self-pay | Admitting: Nurse Practitioner

## 2024-01-18 ENCOUNTER — Ambulatory Visit (INDEPENDENT_AMBULATORY_CARE_PROVIDER_SITE_OTHER): Admitting: Nurse Practitioner

## 2024-01-18 VITALS — Temp 98.6°F | Ht 75.0 in | Wt 232.0 lb

## 2024-01-18 DIAGNOSIS — R0602 Shortness of breath: Secondary | ICD-10-CM

## 2024-01-18 DIAGNOSIS — R0609 Other forms of dyspnea: Secondary | ICD-10-CM | POA: Diagnosis not present

## 2024-01-18 DIAGNOSIS — R42 Dizziness and giddiness: Secondary | ICD-10-CM | POA: Diagnosis not present

## 2024-01-18 NOTE — Progress Notes (Signed)
 Acute Office Visit  Subjective:     Patient ID: Luis Ford, male    DOB: May 11, 1961, 63 y.o.   MRN: [id number]  Chief Complaint  Patient presents with   Shortness of Breath   HPI Luis Ford is a 63 year old male presenting on 01/18/2024 for an acute visit due to ongoing shortness of breath (SOB) for the past 2 months. He was previously evaluated at [facility] for exertional dyspnea. A comprehensive workup was completed, including: CT Angiogram: Negative for pulmonary embolism or other acute chest pathology; notable for moderate to severe coronary artery calcifications. Chest X-ray (01/15/2024): Normal heart size and mediastinal contours. Clear lungs with no evidence of pleural effusion or pneumothorax. No acute abnormalities noted. EKG: Normal sinus rhythm. Cardiac and pulmonary evaluations ruled out blood clots, myocardial infarction, and pneumonia.  The patient also reports:Dizziness upon standing, described as "everything turns white, and I feel like I'm going down." Visual floaters while driving. Waking up with a dry cough. Postprandial sensation of food sitting on his chest without overt pain. He denies chest pain at rest, fever, chills, or recent illness. No known history of GERD or significant cardiac disease documented prior to the recent imaging. Active Ambulatory Problems    Diagnosis Date Noted   Family history of cancer 03/18/2012   Screening for colon cancer 03/18/2012   Essential hypertension 02/20/2015   Hyperlipidemia 04/14/2016   Anxiety 07/04/2016   GERD (gastroesophageal reflux disease) 07/04/2016   Type 2 diabetes mellitus with other specified complication (HCC) 04/15/2019   Diabetic foot ulcer (HCC) 04/15/2019   Primary osteoarthritis of right knee    Osteoarthritis of right knee 10/09/2020   S/P total knee replacement, right 10/09/20  10/22/2020   Encounter for general adult medical examination with abnormal findings 12/02/2023   Acute osteomyelitis of toe  of right foot (HCC) 04/16/2022   Orthostatic dizziness 01/18/2024   SOB (shortness of breath) on exertion 01/18/2024   Resolved Ambulatory Problems    Diagnosis Date Noted   No Resolved Ambulatory Problems   Past Medical History:  Diagnosis Date   Asthma    Complication of anesthesia    Complication of anesthesia    Diabetes mellitus without complication (HCC)    Hyperlipemia    Hypertension    MRSA infection    Sleep apnea    Toe amputee (HCC)     Review of Systems  Constitutional:  Negative for chills and fever.  Eyes:        Floaters  Cardiovascular:  Negative for leg swelling.  Musculoskeletal:  Negative for falls.  Skin:  Negative for itching and rash.  Neurological:  Positive for dizziness.   Negative unless indicated in HPI    Objective:    Temp 98.6 F (37 C)   Ht 6' 3 (1.905 m)   Wt 232 lb (105.2 kg)   SpO2 99%   BMI 29.00 kg/m  BP Readings from Last 3 Encounters:  12/16/23 128/74  12/02/23 (!) 198/113  10/24/20 (!) 148/97   Wt Readings from Last 3 Encounters:  01/18/24 232 lb (105.2 kg)  12/16/23 234 lb 12.8 oz (106.5 kg)  12/02/23 239 lb 6.4 oz (108.6 kg)      Physical Exam Vitals reviewed.  Constitutional:      General: He is not in acute distress. HENT:     Head: Normocephalic and atraumatic.     Nose: Nose normal.     Mouth/Throat:     Mouth: Mucous membranes are moist.  Eyes:     General: No scleral icterus.    Extraocular Movements: Extraocular movements intact.     Conjunctiva/sclera: Conjunctivae normal.     Pupils: Pupils are equal, round, and reactive to light.    Cardiovascular:     Heart sounds: Normal heart sounds.  Pulmonary:     Effort: Pulmonary effort is normal.     Breath sounds: Normal breath sounds.   Musculoskeletal:        General: Normal range of motion.     Right lower leg: No edema.     Left lower leg: No edema.   Skin:    General: Skin is warm and dry.     Findings: No rash.   Neurological:      Mental Status: He is alert and oriented to person, place, and time.     No results found for any visits on 01/18/24.      Assessment & Plan:  Orthostatic dizziness -     Thyroid  Panel With TSH -     Cortisol, free, Serum -     Vitamin B12 -     Urine Microscopic -     LONG TERM MONITOR (3-14 DAYS); Future -     Ambulatory referral to Cardiology  SOB (shortness of breath) on exertion -     Thyroid  Panel With TSH -     LONG TERM MONITOR (3-14 DAYS); Future -     Ambulatory referral to Cardiology  Exertional dyspnea -     Ambulatory referral to Cardiology  Assessment: Exertional dyspnea x 2 months Workup to date includes: 01/15/2024 Normal chest X-ray CT angiogram: No PE, but moderate to severe coronary artery calcifications EKG: Normal sinus rhythm Differential diagnosis includes: Early or stable coronary artery disease (CAD) Deconditioning Undiagnosed heart failure with preserved ejection fraction (HFpEF) Silent GERD or esophageal motility disorder Dizziness upon standing (white out episodes) and visual floaters Possible orthostatic hypotension Consider autonomic dysfunction, anemia, dehydration, or medication side effect Floaters may be unrelated or represent visual disturbance related to hypoperfusion Postprandial chest pressure/discomfort Possible esophageal spasm, GERD, or delayed gastric emptying Less likely cardiac given normal EKG and imaging, but given CAD risk, cannot fully exclude ischemia without functional testing Dry cough on waking Could be related to post-nasal drip, GERD-related microaspiration, or ACE inhibitor use (if applicable)   Luis Ford is a 63 year old Caucasian male seen today for dizziness, no acute distress Orthostatic BP positive: TSH cortisol vitamin D  and urinalysis ordered result pending Long-term heart monitor 14 days, client instructed to mail it out after 14 days Non-Pharmacologic: Increase fluid intake (2-3 L/day) Liberalize salt intake  (if no contraindications) Compression stockings (waist-high) Slow positional changes Elevate head of bed 10-20 degrees Avoid alcohol   The above assessment and management plan was discussed with the patient. The patient verbalized understanding of and has agreed to the management plan. Patient is aware to call the clinic if they develop any new symptoms or if symptoms persist or worsen. Patient is aware when to return to the clinic for a follow-up visit. Patient educated on when it is appropriate to go to the emergency department.  Return for follow-up as already scheduled.  Micheale Schlack [place] Thompson, DNP [facility] [address] [place], [place] [address] [phone]  Note: This document was prepared by [employer] voice dictation technology and any errors that results from this process are unintentional.

## 2024-01-25 ENCOUNTER — Other Ambulatory Visit: Payer: Self-pay | Admitting: Nurse Practitioner

## 2024-01-25 ENCOUNTER — Ambulatory Visit: Payer: Self-pay | Admitting: Nurse Practitioner

## 2024-01-25 DIAGNOSIS — E0590 Thyrotoxicosis, unspecified without thyrotoxic crisis or storm: Secondary | ICD-10-CM | POA: Insufficient documentation

## 2024-01-25 DIAGNOSIS — R7989 Other specified abnormal findings of blood chemistry: Secondary | ICD-10-CM

## 2024-01-25 NOTE — Progress Notes (Unsigned)
 T3 reuptake abnormal twice will order Thyroid  Stimulating Immunoglobulin to r/o possible autoimmune disease, refer to Endocrine based on results  Make a lab appointment

## 2024-01-27 ENCOUNTER — Ambulatory Visit: Admitting: Nurse Practitioner

## 2024-01-27 ENCOUNTER — Other Ambulatory Visit

## 2024-01-27 ENCOUNTER — Ambulatory Visit: Payer: Self-pay | Admitting: Nurse Practitioner

## 2024-01-27 DIAGNOSIS — R7989 Other specified abnormal findings of blood chemistry: Secondary | ICD-10-CM

## 2024-01-28 ENCOUNTER — Telehealth: Payer: Self-pay

## 2024-01-28 ENCOUNTER — Ambulatory Visit: Payer: Self-pay | Admitting: Nurse Practitioner

## 2024-01-28 LAB — CORTISOL, FREE: Cortisol, Free Dialysis, LCMS: 0.395 ug/dL

## 2024-01-28 LAB — THYROID PANEL WITH TSH
Free Thyroxine Index: 1.7 (ref 1.2–4.9)
T3 Uptake Ratio: 23 % — ABNORMAL LOW (ref 24–39)
T4, Total: 7.4 ug/dL (ref 4.5–12.0)
TSH: 1.1 u[IU]/mL (ref 0.450–4.500)

## 2024-01-28 LAB — VITAMIN B12: Vitamin B-12: 233 pg/mL (ref 232–1245)

## 2024-01-28 LAB — THYROID STIMULATING IMMUNOGLOBULIN: Thyroid Stim Immunoglobulin: <0.1 IU/L (ref 0.00–0.55)

## 2024-01-28 NOTE — Telephone Encounter (Signed)
 Copied from CRM [id number]. Topic: Clinical - Request for Lab/Test Order >> Jan 28, 2024 11:24 AM Turkey B wrote: Reason for CRM: pt called in I gave pt lab results

## 2024-02-27 ENCOUNTER — Other Ambulatory Visit: Payer: Self-pay | Admitting: Nurse Practitioner

## 2024-02-27 DIAGNOSIS — E782 Mixed hyperlipidemia: Secondary | ICD-10-CM

## 2024-02-27 DIAGNOSIS — I10 Essential (primary) hypertension: Secondary | ICD-10-CM

## 2024-03-12 ENCOUNTER — Other Ambulatory Visit: Payer: Self-pay | Admitting: Nurse Practitioner

## 2024-03-12 DIAGNOSIS — E782 Mixed hyperlipidemia: Secondary | ICD-10-CM

## 2024-03-14 NOTE — Progress Notes (Deleted)
   Established Patient Office Visit  Subjective  Patient ID: Luis Ford, male    DOB: 1960-09-06  Age: 63 y.o. MRN: [id number]  No chief complaint on file.   HPI  {History (Optional):23778}  ROS Negative unless indicated in HPI   Objective:     There were no vitals taken for this visit. {Vitals History (Optional):23777}  Physical Exam   No results found for any visits on 03/17/24.  {Labs (Optional):23779}    Assessment & Plan:  There are no diagnoses linked to this encounter.  No follow-ups on file.    @Amarilys Lyles  [place], [place]    Note: This document was prepared by [employer] voice dictation technology and any errors that results from this process are unintentional.

## 2024-03-17 ENCOUNTER — Ambulatory Visit: Admitting: Nurse Practitioner

## 2024-03-28 NOTE — Progress Notes (Unsigned)
 Established Patient Office Visit  Subjective  Patient ID: Luis Ford, male    DOB: Jun 16, 1961  Age: 63 y.o. MRN: [id number]  No chief complaint on file.   HPI Demetria Lightsey Hissong Patient Active Problem List   Diagnosis Date Noted   Thyrotoxicosis, acute 01/25/2024   T3 low in serum 01/25/2024   Orthostatic dizziness 01/18/2024   SOB (shortness of breath) on exertion 01/18/2024   Encounter for general adult medical examination with abnormal findings 12/02/2023   Acute osteomyelitis of toe of right foot (HCC) 04/16/2022   S/P total knee replacement, right 10/09/20  10/22/2020   Osteoarthritis of right knee 10/09/2020   Primary osteoarthritis of right knee    Type 2 diabetes mellitus with other specified complication (HCC) 04/15/2019   Diabetic foot ulcer (HCC) 04/15/2019   Anxiety 07/04/2016   GERD (gastroesophageal reflux disease) 07/04/2016   Hyperlipidemia 04/14/2016   Essential hypertension 02/20/2015   Family history of cancer 03/18/2012   Screening for colon cancer 03/18/2012   Past Medical History:  Diagnosis Date   Asthma    Complication of anesthesia    patient states he can only have spinal anesthesia.     Complication of anesthesia    Patient stopped breathing after geneal anesthesia.   Diabetes mellitus without complication (HCC)    Hyperlipemia    Hypertension    MRSA infection    Sleep apnea    Toe amputee [facility])    Past Surgical History:  Procedure Laterality Date   APPENDECTOMY     COLONOSCOPY  2003   [id number]   COLONOSCOPY  04/15/2012   Procedure: COLONOSCOPY;  Surgeon: Lamar CHRISTELLA Hollingshead, MD;  Location: [facility] END[facility] SUITE;  Service: Endoscopy;  Laterality: N/A;  8:45   HERNIA REPAIR     right inguinal   TOE AMPUTATION     TOTAL KNEE ARTHROPLASTY Right 10/09/2020   Procedure: RIGHT TOTAL KNEE ARTHROPLASTY;  Surgeon: Margrette Taft BRAVO, MD;  Location: [facility] ORS;  Service: Orthopedics;  Laterality: Right;   TRACHEOSTOMY     infant/closure as child    TRACHEOSTOMY     pt was 17 days old when he had a trach placed.    Social History   Tobacco Use   Smoking status: Never   Smokeless tobacco: Never  Vaping Use   Vaping status: Never Used  Substance Use Topics   Alcohol use: No   Drug use: No   Social History   Socioeconomic History   Marital status: Single    Spouse name: Not on file   Number of children: 2   Years of education: Not on file   Highest education level: Not on file  Occupational History   Occupation: Tour manager: [employer]  Tobacco Use   Smoking status: Never   Smokeless tobacco: Never  Vaping Use   Vaping status: Never Used  Substance and Sexual Activity   Alcohol use: No   Drug use: No   Sexual activity: Not on file  Other Topics Concern   Not on file  Social History Narrative   2 healthy sons    Lives w/ girlfriend   Social Drivers of Corporate investment banker Strain: Low Risk  (04/04/2022)   Received from [facility]   Overall Financial Resource Strain (CARDIA)    Difficulty of Paying Living Expenses: Not hard at all  Food Insecurity: No Food Insecurity (04/04/2022)   [facility]   Hunger Vital Sign  Within the past 12 months, you worried that your food would run out before you got the money to buy more.: Never true    Within the past 12 months, the food you bought just didn't last and you didn't have money to get more.: Never true  Transportation Needs: No Transportation Needs (04/04/2022)   Received [facility]   PRAPARE - Transportation    Lack of Transportation (Medical): No    Lack of Transportation (Non-Medical): No  Physical Activity: Not on file  Stress: Not on file  Social Connections: Not on file  Intimate Partner Violence: Not on file   Family Status  Relation Name Status   Mother  Deceased   Father  Deceased       MVA   Brother  Alive   Brother  Alive   Sister  Alive   Brother  (Not Specified)  No partnership data on file    Family History  Problem Relation Age of Onset   Coronary artery disease Mother    Colon cancer Brother 31   Allergies  Allergen Reactions   Penicillins Other (See Comments)    Childhood illness       ROS Negative unless indicated in HPI   Objective:     There were no vitals taken for this visit. BP Readings from Last 3 Encounters:  12/16/23 128/74  12/02/23 (!) 198/113  10/24/20 (!) 148/97   Wt Readings from Last 3 Encounters:  01/18/24 232 lb (105.2 kg)  12/16/23 234 lb 12.8 oz (106.5 kg)  12/02/23 239 lb 6.4 oz (108.6 kg)      Physical Exam   No results found for any visits on 03/29/24.  Last CBC Lab Results  Component Value Date   WBC 8.7 12/02/2023   HGB 16.3 12/02/2023   HCT 48.8 12/02/2023   MCV 88 12/02/2023   MCH 29.2 12/02/2023   RDW 13.1 12/02/2023   PLT 307 12/02/2023   Last metabolic panel Lab Results  Component Value Date   GLUCOSE 107 (H) 12/02/2023   NA 139 12/02/2023   K 5.1 12/02/2023   CL 100 12/02/2023   CO2 23 12/02/2023   BUN 20 12/02/2023   CREATININE 1.19 12/02/2023   EGFR 69 12/02/2023   CALCIUM  9.5 12/02/2023   PROT 7.0 12/02/2023   ALBUMIN 4.5 12/02/2023   LABGLOB 2.5 12/02/2023   AGRATIO 2.1 09/14/2020   BILITOT 0.3 12/02/2023   ALKPHOS 97 12/02/2023   AST 18 12/02/2023   ALT 20 12/02/2023   ANIONGAP 9 10/10/2020   Last lipids Lab Results  Component Value Date   CHOL 293 (H) 12/02/2023   HDL 34 (L) 12/02/2023   LDLCALC 205 (H) 12/02/2023   TRIG 271 (H) 12/02/2023   CHOLHDL 8.6 (H) 12/02/2023   Last hemoglobin A1c Lab Results  Component Value Date   HGBA1C 6.0 (H) 12/02/2023   Last thyroid  functions Lab Results  Component Value Date   TSH 1.100 01/18/2024   T4TOTAL 7.4 01/18/2024        Assessment & Plan:  There are no diagnoses linked to this encounter. Continue healthy lifestyle choices, including diet (rich in fruits, vegetables, and lean proteins, and low in salt and simple carbohydrates)  and exercise (at least 30 minutes of moderate physical activity daily).     The above assessment and management plan was discussed with the patient. The patient verbalized understanding of and has agreed to the management plan. Patient is aware to call the clinic  if they develop any new symptoms or if symptoms persist or worsen. Patient is aware when to return to the clinic for a follow-up visit. Patient educated on when it is appropriate to go to the emergency department.  No follow-ups on file.    Roniesha Hollingshead [place], DNP [facility] [address] [place], [place] [address] [phone]    Note: This document was prepared by [employer] voice dictation technology and any errors that results from this process are unintentional.

## 2024-03-29 ENCOUNTER — Encounter: Payer: Self-pay | Admitting: Nurse Practitioner

## 2024-03-29 ENCOUNTER — Ambulatory Visit: Payer: Self-pay | Admitting: Nurse Practitioner

## 2024-03-29 ENCOUNTER — Ambulatory Visit: Admitting: Nurse Practitioner

## 2024-03-29 VITALS — BP 117/81 | HR 95 | Temp 97.5°F | Ht 75.0 in | Wt 223.6 lb

## 2024-03-29 DIAGNOSIS — I10 Essential (primary) hypertension: Secondary | ICD-10-CM | POA: Diagnosis not present

## 2024-03-29 DIAGNOSIS — R051 Acute cough: Secondary | ICD-10-CM | POA: Diagnosis not present

## 2024-03-29 DIAGNOSIS — Z1211 Encounter for screening for malignant neoplasm of colon: Secondary | ICD-10-CM

## 2024-03-29 DIAGNOSIS — E782 Mixed hyperlipidemia: Secondary | ICD-10-CM | POA: Diagnosis not present

## 2024-03-29 DIAGNOSIS — E1169 Type 2 diabetes mellitus with other specified complication: Secondary | ICD-10-CM

## 2024-03-29 LAB — BAYER DCA HB A1C WAIVED: HB A1C (BAYER DCA - WAIVED): 5.6 % (ref 4.8–5.6)

## 2024-03-29 MED ORDER — LISINOPRIL-HYDROCHLOROTHIAZIDE 20-25 MG PO TABS: 1.0000 | ORAL_TABLET | Freq: Every day | ORAL | 3 refills | Status: DC

## 2024-03-29 MED ORDER — GUAIFENESIN 400 MG PO TABS: 400.0000 mg | ORAL_TABLET | Freq: Four times a day (QID) | ORAL | 0 refills | Status: AC | PRN

## 2024-04-13 ENCOUNTER — Ambulatory Visit: Payer: Self-pay | Attending: Internal Medicine | Admitting: Internal Medicine

## 2024-04-13 ENCOUNTER — Ambulatory Visit: Payer: Self-pay | Admitting: Internal Medicine

## 2024-04-13 ENCOUNTER — Encounter: Payer: Self-pay | Admitting: Internal Medicine

## 2024-04-13 VITALS — BP 114/74 | HR 88 | Ht 78.0 in | Wt 226.2 lb

## 2024-04-13 DIAGNOSIS — I25118 Atherosclerotic heart disease of native coronary artery with other forms of angina pectoris: Secondary | ICD-10-CM | POA: Diagnosis present

## 2024-04-13 DIAGNOSIS — R079 Chest pain, unspecified: Secondary | ICD-10-CM | POA: Diagnosis present

## 2024-04-13 DIAGNOSIS — I2510 Atherosclerotic heart disease of native coronary artery without angina pectoris: Secondary | ICD-10-CM | POA: Insufficient documentation

## 2024-04-13 DIAGNOSIS — N179 Acute kidney failure, unspecified: Secondary | ICD-10-CM | POA: Diagnosis present

## 2024-04-13 DIAGNOSIS — I10 Essential (primary) hypertension: Secondary | ICD-10-CM | POA: Diagnosis not present

## 2024-04-13 MED ORDER — LISINOPRIL 20 MG PO TABS: 20.0000 mg | ORAL_TABLET | Freq: Every day | ORAL | 2 refills | Status: AC

## 2024-04-13 MED ORDER — METOPROLOL TARTRATE 25 MG PO TABS: 25.0000 mg | ORAL_TABLET | Freq: Two times a day (BID) | ORAL | 3 refills | Status: DC

## 2024-04-13 MED ORDER — NITROGLYCERIN 0.4 MG SL SUBL: 0.4000 mg | SUBLINGUAL_TABLET | SUBLINGUAL | 2 refills | Status: AC | PRN

## 2024-04-13 NOTE — Progress Notes (Signed)
 Cardiology Office Note  Date: 04/13/2024   ID: Vera, Wishart September 05, 1960, MRN [id number]  PCP:  [name], NP  Cardiologist:  None Electrophysiologist:  None   History of Present Illness: Luis Ford is a 63 y.o. male  Referred to cardiology clinic for evaluation DOE.  Patient reported that he was extremely active at baseline.  He works from [employer] to [employer].  However in the last 1 year, he noticed DOE and chest tightness with any minimal exertional activities like walking around the house, working in the yard for 10 minutes etc.  Chest tightness resolves in 10 to 15 minutes after he takes rest.  Due to DOE and chest tightness, he underwent CT angio chest in June 2024 at [facility] that showed no evidence of acute pulmonary embolism but it did show moderate to severe coronary artery calcifications.  He never smoked cigarettes.  He has a history of diabetes mellitus.  He also reports fatigue, low stamina levels.  No dizziness, syncope, palpitations, leg swelling.  No orthopnea, PND as well.  proBNP was within normal limits in June 2025.  Past Medical History:  Diagnosis Date   Asthma    Complication of anesthesia    patient states he can only have spinal anesthesia.     Complication of anesthesia    Patient stopped breathing after geneal anesthesia.   Diabetes mellitus without complication (HCC)    Hyperlipemia    Hypertension    MRSA infection    Sleep apnea    Toe amputee     Past Surgical History:  Procedure Laterality Date   APPENDECTOMY     COLONOSCOPY  2003   [facility]-normal   COLONOSCOPY  04/15/2012   Procedure: COLONOSCOPY;  Surgeon: Lamar CHRISTELLA Hollingshead, MD;  [facility];  Service: Endoscopy;  Laterality: N/A;  8:45   HERNIA REPAIR     right inguinal   TOE AMPUTATION     TOTAL KNEE ARTHROPLASTY Right 10/09/2020   Procedure: RIGHT TOTAL KNEE ARTHROPLASTY;  Surgeon: Margrette Taft BRAVO, MD;  Location: [facility];  Service: Orthopedics;   Laterality: Right;   TRACHEOSTOMY     infant/closure as child   TRACHEOSTOMY     pt was 78 days old when he had a trach placed.     Current Outpatient Medications  Medication Sig Dispense Refill   amLODipine  (NORVASC ) 5 MG tablet TAKE 1 TABLET(5 MG) BY MOUTH DAILY 90 tablet 0   aspirin  81 MG chewable tablet Chew 1 tablet (81 mg total) by mouth daily. 90 tablet 0   b complex vitamins capsule Take 1 capsule by mouth daily.     ezetimibe  (ZETIA ) 10 MG tablet TAKE 1 TABLET(10 MG) BY MOUTH DAILY 90 tablet 0   guaifenesin  (HUMIBID E) 400 MG TABS tablet Take 1 tablet (400 mg total) by mouth every 6 (six) hours as needed. 30 tablet 0   lisinopril -hydrochlorothiazide  (ZESTORETIC ) 20-25 MG tablet Take 1 tablet by mouth daily. 90 tablet 3   metFORMIN  (GLUCOPHAGE -XR) 500 MG 24 hr tablet TAKE 1 TABLET(500 MG) BY MOUTH TWICE DAILY WITH A MEAL 180 tablet 0   rosuvastatin  (CRESTOR ) 40 MG tablet TAKE 1 TABLET(40 MG) BY MOUTH DAILY 90 tablet 0   No current facility-administered medications for this visit.   Allergies:  Penicillins   Social History: The patient  reports that he has never smoked. He has never used smokeless tobacco. He reports that he does not drink alcohol and does not use  drugs.   Family History: The patient's family history includes Colon cancer (age of onset: 24) in his brother; Coronary artery disease in his mother.   ROS:  Please see the history of present illness. Otherwise, complete review of systems is positive for none  All other systems are reviewed and negative.   Physical Exam: VS:  BP 114/74   Pulse 88   Ht 6' 6 (1.981 m)   Wt 226 lb 3.2 oz (102.6 kg)   SpO2 97%   BMI 26.14 kg/m , BMI Body mass index is 26.14 kg/m.  Wt Readings from Last 3 Encounters:  04/13/24 226 lb 3.2 oz (102.6 kg)  03/29/24 223 lb 9.6 oz (101.4 kg)  01/18/24 232 lb (105.2 kg)    General: Patient appears comfortable at rest. HEENT: Conjunctiva and lids normal, oropharynx clear with moist  mucosa. Neck: Supple, no elevated JVP or carotid bruits, no thyromegaly. Lungs: Clear to auscultation, nonlabored breathing at rest. Cardiac: Regular rate and rhythm, no S3 or significant systolic murmur, no pericardial rub. Abdomen: Soft, nontender, no hepatomegaly, bowel sounds present, no guarding or rebound. Extremities: No pitting edema, distal pulses 2+. Skin: Warm and dry. Musculoskeletal: No kyphosis. Neuropsychiatric: Alert and oriented x3, affect grossly appropriate.  Recent Labwork: 12/02/2023: ALT 20; AST 18; BUN 20; Creatinine, Ser 1.19; Hemoglobin 16.3; Platelets 307; Potassium 5.1; Sodium 139 01/18/2024: TSH 1.100     Component Value Date/Time   CHOL 293 (H) 12/02/2023 1459   TRIG 271 (H) 12/02/2023 1459   HDL 34 (L) 12/02/2023 1459   CHOLHDL 8.6 (H) 12/02/2023 1459   LDLCALC 205 (H) 12/02/2023 1459    Assessment and Plan:  Stable ischemic heart disease Moderate to severe coronary calcifications - New DOE and chest tightness with exertion lasting for about 10 to 15 minutes x 1 year.  Frequency almost daily.  Cardiac risk factors include HTN, DM 2.  No smoking.  CT angio chest in June 2025 at [facility] showed no evidence of PE but moderate to severe coronary calcifications. - He will need to be started on antianginal therapy and requires ischemia evaluation. - Discontinue HCTZ, start metoprolol  tartrate 25 mg twice daily. - Obtain echocardiogram and CT cardiac.  Review of labs showed serum creatinine 1.65 (has AKI) on 03/23/2024 when he was diagnosed with COVID-19 infection.  Prior serum creatinine levels are within normal limits.  Will obtain BMP today.  If his BMP shows CKD, will cancel CT cardiac and order exercise Myoview versus Lexiscan. - SL NTG 0.4 mg as needed for chest pain. - ER precautions for chest pain provided.  AKI - Serum creatinine 1.65 on 03/23/2024 when he was diagnosed with COVID-19 infection.  Repeat BMP.  HTN, controlled - Discontinue HCTZ, continue  lisinopril  20 mg once daily. - Start metoprolol  tartrate 25 mg BID.  HLD, not at goal - LDL 205, elevated, TG 271, elevated.  Continue rosuvastatin  40 mg nightly, Zetia  10 mg once daily.  He will need repeat lipid panel before further changes can be made.  Will get lipid panel in the next clinic visit.   40 minutes spent in reviewing prior records, imaging, test/reports/labs/discussion of the problems with the patient, documentation and answered all his questions.     Medication Adjustments/Labs and Tests Ordered: Current medicines are reviewed at length with the patient today.  Concerns regarding medicines are outlined above.    Disposition:  Follow up 6 weeks  Signed Darrell Leonhardt Priya Aitana Burry, MD, 04/13/2024 1:05 PM    [facility] at [facility] [address] [place], [place], [place] [address]

## 2024-04-13 NOTE — Patient Instructions (Addendum)
 Medication Instructions:  Your physician has recommended you make the following change in your medication:  Stop taking Lisinopril - Hydrochlorothiazide   Start taking Lisinopril  20 mg once daily Start Metoprolol  Tartrate 25 mg twice daily  Take Nitroglycerin  0.4 mg as needed for chest pain. Dissolve one under tongue for chest pain every 5 minutes up to 3 doses. If no relief, proceed to ED or call 911 Continue taking all other medications as prescribed   Labwork: BMET to be completed today at [facility]  Testing/Procedures: Your physician has requested that you have an echocardiogram. Echocardiography is a painless test that uses sound waves to create images of your heart. It provides your doctor with information about the size and shape of your heart and how well your heart's chambers and valves are working. This procedure takes approximately one hour. There are no restrictions for this procedure. Please do NOT wear cologne, perfume, aftershave, or lotions (deodorant is allowed). Please arrive 15 minutes prior to your appointment time.  Please note: We ask at that you not bring children with you during ultrasound (echo/ vascular) testing. Due to room size and safety concerns, children are not allowed in the ultrasound rooms during exams. Our front office staff cannot provide observation of children in our lobby area while testing is being conducted. An adult accompanying a patient to their appointment will only be allowed in the ultrasound room at the discretion of the ultrasound technician under special circumstances. We apologize for any inconvenience.    Your cardiac CT will be scheduled at one of the below locations:   [facility] [address] [place], [place] [address] [phone] (Severe contrast allergies only)  OR   [facility] [address]  [place], [place] [address] [phone]  If scheduled at [facility], please arrive at the [facility] and Children's Entrance (Entrance C2) of [facility] 30 minutes prior to test start time. You can use the FREE valet parking offered at entrance C (encouraged to control the heart rate for the test)  Proceed to the [facility] Radiology Department (first floor) to check-in and test prep.  All radiology patients and guests should use entrance C2 at [facility], accessed from [facility], even though the hospital's physical address listed is [address]  If scheduled at the Heart and Vascular Tower at [address], please enter the parking lot using the [address] entrance and use the FREE valet service at the patient drop-off area. Enter the building and check-in with registration on the main floor.  There is spacious parking and easy access to the radiology department from the [facility] entrance. Please enter here and check-in with the desk attendant.    Please follow these instructions carefully (unless otherwise directed):  An IV will be required for this test and Nitroglycerin  will be given.   On the Night Before the Test: Be sure to Drink plenty of water . Do not consume any caffeinated/decaffeinated beverages or chocolate 12 hours prior to your test. Do not take any antihistamines 12 hours prior to your test.  If the patient has contrast allergy:No allergy   On the Day of the Test: Drink plenty of water  until 1 hour prior to the test. Do not eat any food 1 hour prior to test. You may take your regular medications prior to the test.  Take Metoprolol  100 mg(Lopressor ) two hours prior to test. (4 tablets of the  25 mg) If you take Furosemide/Hydrochlorothiazide /Spironolactone/Chlorthalidone, please HOLD on the morning of the test. Patients who wear a continuous glucose monitor MUST remove the device prior to scanning.      After the Test: Drink plenty of water . After receiving IV  contrast, you may experience a mild flushed feeling. This is normal. On occasion, you may experience a mild rash up to 24 hours after the test. This is not dangerous. If this occurs, you can take Benadryl  25 mg, Zyrtec, Claritin, or Allegra and increase your fluid intake. (Patients taking Tikosyn should avoid Benadryl , and may take Zyrtec, Claritin, or Allegra) If you experience trouble breathing, this can be serious. If it is severe call 911 IMMEDIATELY. If it is mild, please call our office.  We will call to schedule your test 2-4 weeks out understanding that some insurance companies will need an authorization prior to the service being performed.   For more information and frequently asked questions, please visit our website : [email or web address]  For non-scheduling related questions, please contact the cardiac imaging nurse navigator should you have any questions/concerns: Cardiac Imaging Nurse Navigators Direct Office Dial: [phone]   For scheduling needs, including cancellations and rescheduling, please call [place], [phone].   Follow-Up: Your physician recommends that you schedule a follow-up appointment in: 6 weeks  Any Other Special Instructions Will Be Listed Below (If Applicable). Thank you for choosing [facility]!     If you need a refill on your cardiac medications before your next appointment, please call your pharmacy.

## 2024-04-14 NOTE — Telephone Encounter (Signed)
-----   Message from Luis Ford sent at 04/13/2024  4:34 PM EDT ----- Serum creatinine 1.66. Cancel CT cardiac. Meryl Ming. ----- Message ----- From: Gretel Darryle RAMAN Sent: 04/13/2024   4:25 PM EDT To: Luis P Mallipeddi, MD

## 2024-04-14 NOTE — Telephone Encounter (Signed)
 The patient has been notified of the result and verbalized understanding.  All questions (if any) were answered. Advised patient that I have cancelled his Coronary CTA and placed order for Lexiscan. Will leave instructions at the front desk and when he comes to pick them up can go ahead and scheduled test. Patient verbalized understanding  Littie CHRISTELLA Croak, CMA 04/14/2024 10:38 AM

## 2024-04-17 LAB — COLOGUARD: COLOGUARD: NEGATIVE

## 2024-04-19 ENCOUNTER — Encounter (HOSPITAL_COMMUNITY)
Admission: RE | Admit: 2024-04-19 | Discharge: 2024-04-19 | Disposition: A | Discharge status: Home / Self Care | Source: Ambulatory Visit | Attending: Internal Medicine | Admitting: Internal Medicine

## 2024-04-19 ENCOUNTER — Ambulatory Visit (HOSPITAL_COMMUNITY)
Admission: RE | Admit: 2024-04-19 | Discharge: 2024-04-19 | Disposition: A | Discharge status: Home / Self Care | Source: Ambulatory Visit | Attending: Internal Medicine | Admitting: Internal Medicine

## 2024-04-19 ENCOUNTER — Encounter (HOSPITAL_COMMUNITY): Payer: Self-pay

## 2024-04-19 ENCOUNTER — Other Ambulatory Visit: Payer: Self-pay | Admitting: Physician Assistant

## 2024-04-19 DIAGNOSIS — R079 Chest pain, unspecified: Secondary | ICD-10-CM | POA: Insufficient documentation

## 2024-04-19 LAB — NM MYOCAR MULTI W/SPECT W/WALL MOTION / EF
Base ST Depression (mm): 0 mm
LV dias vol: 74 mL (ref 62–150)
LV sys vol: 18 mL (ref 4.2–5.8)
MPHR: 157 {beats}/min
Nuc Stress EF: 76 %
Peak HR: 121 {beats}/min
Percent HR: 77 %
RATE: 0.3
Rest HR: 85 {beats}/min
Rest Nuclear Isotope Dose: 10.4 mCi
SDS: 4
SRS: 1
SSS: 5
ST Depression (mm): 0 mm
Stress Nuclear Isotope Dose: 33 mCi
TID: 1.17

## 2024-04-19 MED ORDER — TECHNETIUM TC 99M TETROFOSMIN IV KIT
30.0000 | PACK | Freq: Once | INTRAVENOUS | Status: AC | PRN
  Administered 2024-04-19: 33 via INTRAVENOUS

## 2024-04-19 MED ORDER — SODIUM CHLORIDE FLUSH 0.9 % IV SOLN
INTRAVENOUS | Status: AC
  Administered 2024-04-19: 10 mL via INTRAVENOUS
  Filled 2024-04-19: qty 10

## 2024-04-19 MED ORDER — REGADENOSON 0.4 MG/5ML IV SOLN
INTRAVENOUS | Status: AC
  Administered 2024-04-19: 0.4 mg via INTRAVENOUS
  Filled 2024-04-19: qty 5

## 2024-04-19 MED ORDER — TECHNETIUM TC 99M TETROFOSMIN IV KIT
10.0000 | PACK | Freq: Once | INTRAVENOUS | Status: AC | PRN
  Administered 2024-04-19: 10.4 via INTRAVENOUS

## 2024-04-19 NOTE — Progress Notes (Signed)
     Luis Ford presented for a Lexiscan nuclear stress test today.  I Lorette CINDERELLA Kapur, PA-C, provided direct supervision and was present during the stress portion of the study today, which was completed without significant symptoms, immediate complications, or acute ST/T changes on ECG.  Stress imaging is pending at this time.  Preliminary ECG findings may be listed in the chart, but the stress test result will not be finalized until perfusion imaging is complete.  Lorette CINDERELLA Kapur, PA-C  04/19/2024, 9:03 AM

## 2024-04-20 ENCOUNTER — Ambulatory Visit: Payer: Self-pay | Admitting: Internal Medicine

## 2024-04-25 NOTE — Telephone Encounter (Signed)
 The patient has been notified of the result and verbalized understanding.  All questions (if any) were answered. Littie CHRISTELLA Croak, CMA 04/25/2024 4:58 PM

## 2024-04-25 NOTE — Telephone Encounter (Signed)
-----   Message from Vishnu P Mallipeddi sent at 04/20/2024 11:40 AM EDT ----- Abnormal stress test. Continue metoprolol  for anti-anginal therapy. Ask if his chest tightness improved in duration and frequency after starting metoprolol . If no improvement, schedule follow up  visit sooner in 2-3 weeks. Okay on hospital day. ----- Message ----- From: Stacia Diannah SQUIBB, MD Sent: 04/19/2024   1:19 PM EDT To: Vishnu P Mallipeddi, MD

## 2024-04-28 ENCOUNTER — Ambulatory Visit: Attending: Internal Medicine

## 2024-04-28 DIAGNOSIS — R079 Chest pain, unspecified: Secondary | ICD-10-CM | POA: Insufficient documentation

## 2024-04-28 LAB — ECHOCARDIOGRAM COMPLETE
AR max vel: 3.03 cm2
AV Peak grad: 10.5 mmHg
Ao pk vel: 1.62 m/s
Area-P 1/2: 4.21 cm2
Calc EF: 70.4 %
S' Lateral: 2.7 cm
Single Plane A2C EF: 73.9 %
Single Plane A4C EF: 66.5 %

## 2024-05-23 ENCOUNTER — Ambulatory Visit: Attending: Internal Medicine | Admitting: Internal Medicine

## 2024-05-23 ENCOUNTER — Encounter: Payer: Self-pay | Admitting: Internal Medicine

## 2024-05-23 VITALS — BP 124/70 | HR 66 | Ht 78.0 in | Wt 228.8 lb

## 2024-05-23 DIAGNOSIS — R9439 Abnormal result of other cardiovascular function study: Secondary | ICD-10-CM | POA: Insufficient documentation

## 2024-05-23 DIAGNOSIS — E785 Hyperlipidemia, unspecified: Secondary | ICD-10-CM | POA: Insufficient documentation

## 2024-05-23 MED ORDER — METOPROLOL TARTRATE 25 MG PO TABS: 25.0000 mg | ORAL_TABLET | Freq: Two times a day (BID) | ORAL | 3 refills | Status: AC

## 2024-05-23 NOTE — Patient Instructions (Signed)
 Medication Instructions:  Your physician recommends that you continue on your current medications as directed. Please refer to the Current Medication list given to you today.   Labwork: Lipid Panel and Lipoprotein-a to be completed today [facility]  Testing/Procedures: None  Follow-Up: Your physician recommends that you schedule a follow-up appointment in: 3 months  Any Other Special Instructions Will Be Listed Below (If Applicable). Thank you for choosing [facility]!     If you need a refill on your cardiac medications before your next appointment, please call your pharmacy.

## 2024-05-23 NOTE — Progress Notes (Signed)
 Cardiology Office Note  Date: 05/23/2024   ID: Luis Ford, DOB 1961/01/12, MRN [id number]  PCP:  Deitra Morton Luis Nena, NP  Cardiologist:  [name], MD Electrophysiologist:  None   History of Present Illness: Luis Ford is a 63 y.o. male  Referred to cardiology clinic for evaluation DOE.  Sep 2025: Patient reported that he was extremely active at baseline.  He works from [employer] to [employer].  However in the last 1 year, he noticed DOE and chest tightness with any minimal exertional activities like walking around the house, working in the yard for 10 minutes etc.  Chest tightness resolves in 10 to 15 minutes after he takes rest.  Due to DOE and chest tightness, he underwent CT angio chest in June 2024 at [facility] that showed no evidence of acute pulmonary embolism but it did show moderate to severe coronary artery calcifications.  He never smoked cigarettes.  He has a history of diabetes mellitus.  He also reports fatigue, low stamina levels.  No dizziness, syncope, palpitations, leg swelling.  No orthopnea, PND as well.  proBNP was within normal limits in June 2025.  Patient is here today for follow-up visit.  Stress test is abnormal.  Findings are consistent with ischemia and no infarction.  Medium sized reversible function defect with mild reduction in uptake present in the mid to basal inferior wall with normal wall motion in the defect area consistent with ischemia.  After starting metoprolol   tartrate 25 mg twice daily, his chest pain and SOB have completely resolved.  Has been active since then.  No further chest pains on metoprolol .  He has SL NTG with him.  No dizziness, syncope, palpitations, leg swelling.  Past Medical History:  Diagnosis Date   Asthma    Complication of anesthesia    patient states he can only have spinal anesthesia.     Complication of anesthesia    Patient stopped breathing after geneal anesthesia.   Diabetes mellitus without  complication (HCC)    Hyperlipemia    Hypertension    MRSA infection    Sleep apnea    Toe amputee     Past Surgical History:  Procedure Laterality Date   APPENDECTOMY     COLONOSCOPY  2003   [facility]-normal   COLONOSCOPY  04/15/2012   Procedure: COLONOSCOPY;  Surgeon: Lamar CHRISTELLA Hollingshead, MD;  [facility];  Service: Endoscopy;  Laterality: N/A;  8:45   HERNIA REPAIR     right inguinal   TOE AMPUTATION     TOTAL KNEE ARTHROPLASTY Right 10/09/2020   Procedure: RIGHT TOTAL KNEE ARTHROPLASTY;  Surgeon: Margrette Taft BRAVO, MD;  Location: [facility];  Service: Orthopedics;  Laterality: Right;   TRACHEOSTOMY     infant/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     Current Outpatient Medications  Medication Sig Dispense Refill   amLODipine  (NORVASC ) 5 MG tablet TAKE 1 TABLET(5 MG) BY MOUTH DAILY 90 tablet 0   aspirin  81 MG chewable tablet Chew 1 tablet (81 mg total) by mouth daily. 90 tablet 0   b complex vitamins capsule Take 1 capsule by mouth daily.     ezetimibe  (ZETIA ) 10 MG tablet TAKE 1 TABLET(10 MG) BY MOUTH DAILY 90 tablet 0   guaifenesin  (HUMIBID E) 400 MG TABS tablet Take 1 tablet (400 mg total) by mouth every 6 (six) hours as needed. 30 tablet  0   lisinopril  (ZESTRIL ) 20 MG tablet Take 1 tablet (20 mg total) by mouth daily. 90 tablet 2   metFORMIN  (GLUCOPHAGE -XR) 500 MG 24 hr tablet TAKE 1 TABLET(500 MG) BY MOUTH TWICE DAILY WITH A MEAL 180 tablet 0   metoprolol  tartrate (LOPRESSOR ) 25 MG tablet Take 1 tablet (25 mg total) by mouth 2 (two) times daily. 180 tablet 3   nitroGLYCERIN  (NITROSTAT ) 0.4 MG SL tablet Place 1 tablet (0.4 mg total) under the tongue every 5 (five) minutes x 3 doses as needed (if no relief after 3rd dose proceed to ED or call 911). 25 tablet 2   rosuvastatin  (CRESTOR ) 40 MG tablet TAKE 1 TABLET(40 MG) BY MOUTH DAILY 90 tablet 0   No current facility-administered medications for this visit.   Allergies:   Penicillins   Social History: The patient  reports that he has never smoked. He has never used smokeless tobacco. He reports that he does not drink alcohol and does not use drugs.   Family History: The patient's family history includes Colon cancer (age of onset: 55) in his brother; Coronary artery disease in his mother.   ROS:  Please see the history of present illness. Otherwise, complete review of systems is positive for none  All other systems are reviewed and negative.   Physical Exam: VS:  BP 124/70   Pulse 66   Ht 6' 6 (1.981 m)   Wt 228 lb 12.8 oz (103.8 kg)   SpO2 96%   BMI 26.44 kg/m , BMI Body mass index is 26.44 kg/m.  Wt Readings from Last 3 Encounters:  05/23/24 228 lb 12.8 oz (103.8 kg)  04/13/24 226 lb 3.2 oz (102.6 kg)  03/29/24 223 lb 9.6 oz (101.4 kg)    General: Patient appears comfortable at rest. HEENT: Conjunctiva and lids normal, oropharynx clear with moist mucosa. Neck: Supple, no elevated JVP or carotid bruits, no thyromegaly. Lungs: Clear to auscultation, nonlabored breathing at rest. Cardiac: Regular rate and rhythm, no S3 or significant systolic murmur, no pericardial rub. Abdomen: Soft, nontender, no hepatomegaly, bowel sounds present, no guarding or rebound. Extremities: No pitting edema, distal pulses 2+. Skin: Warm and dry. Musculoskeletal: No kyphosis. Neuropsychiatric: Alert and oriented x3, affect grossly appropriate.  Recent Labwork: 12/02/2023: ALT 20; AST 18; BUN 20; Creatinine, Ser 1.19; Hemoglobin 16.3; Platelets 307; Potassium 5.1; Sodium 139 01/18/2024: TSH 1.100     Component Value Date/Time   CHOL 293 (H) 12/02/2023 1459   TRIG 271 (H) 12/02/2023 1459   HDL 34 (L) 12/02/2023 1459   CHOLHDL 8.6 (H) 12/02/2023 1459   LDLCALC 205 (H) 12/02/2023 1459    Assessment and Plan:  Stable ischemic heart disease Moderate to severe coronary calcifications Positive cardiac stress test - Initially referred to cardiology clinic for new  symptoms of exertional chest tightness and DOE for about 10 to 15 minutes for the last 1 year.  Frequency almost daily.  He has imaging evidence of moderate to severe coronary calcifications.  After starting metoprolol  tartrate 25 mg twice daily, his chest pain and DOE completely resolved.  NM stress test showed inferior wall ischemia.  Echo normal. - Cardiac risk factors include HTN, DM 2. - Continue antianginal therapy with metoprolol  tartrate 25 mg twice daily and amlodipine  5 mg once daily. - Continue cardioprotective medications with aspirin  81 mg once daily, rosuvastatin  40 mg nightly and Zetia  10 mg once daily. - If chest pains become pharmacologically refractory, we will schedule him for LHC.  AKI versus  new CKD - Serum creatinine 1.65 on 03/23/2024 when he was diagnosed with COVID-19 infection.  Repeat BMP showed serum creatinine 1.6.  Follow-up with PCP.  HTN, controlled - Continue lisinopril  20 mg once daily, metoprolol  tartrate 25 mg twice daily.  HLD, not at goal - LDL 205, elevated, TG 271, elevated.  Repeat lipid panel today.  Continue rosuvastatin  40 mg nightly, Zetia  10 mg once daily.  Obtain LPA levels.  If LDL continues to be more than 70, he will need to be started on PCSK9 inhibitors or Leqvio.  He verbalized understanding and is agreeable to the plan.  Refilled metoprolol .  30 minutes spent in reviewing prior records, imaging, test/reports/labs/discussion of the problems with the patient, documentation and answered all his questions.     Medication Adjustments/Labs and Tests Ordered: Current medicines are reviewed at length with the patient today.  Concerns regarding medicines are outlined above.    Disposition:  Follow up 3 months  Signed Lorice Lafave Priya Tamme Mozingo, MD, 05/23/2024 9:56 AM    [facility] at [facility] [address] [place], [place], [place] [address]

## 2024-05-24 ENCOUNTER — Telehealth: Payer: Self-pay | Admitting: Internal Medicine

## 2024-05-24 NOTE — Telephone Encounter (Signed)
 Pt calling to f/u on lab results. Please advise

## 2024-05-24 NOTE — Telephone Encounter (Signed)
 Left message to call the office back.

## 2024-05-25 NOTE — Telephone Encounter (Signed)
 Left a message for patient to call office regarding testing results.

## 2024-05-25 NOTE — Telephone Encounter (Signed)
 Pt was returning nurse call and is requesting a callback tomorrow morning. Please advise

## 2024-05-26 NOTE — Telephone Encounter (Signed)
 Left a message for patient to call office regarding testing results.

## 2024-05-26 NOTE — Telephone Encounter (Signed)
 Pt is requesting a callback at [phone] and if he doesn't answer he'd like for a detailed message to be left due to him not being able to speak with anyone each time he calls back. Please advise

## 2024-05-26 NOTE — Telephone Encounter (Signed)
 Returned call and left detailed message on pt's answering machine.

## 2024-05-29 ENCOUNTER — Other Ambulatory Visit: Payer: Self-pay | Admitting: Nurse Practitioner

## 2024-05-29 DIAGNOSIS — I10 Essential (primary) hypertension: Secondary | ICD-10-CM

## 2024-05-30 ENCOUNTER — Other Ambulatory Visit: Payer: Self-pay | Admitting: Nurse Practitioner

## 2024-05-31 ENCOUNTER — Other Ambulatory Visit: Payer: Self-pay | Admitting: Nurse Practitioner

## 2024-05-31 DIAGNOSIS — E782 Mixed hyperlipidemia: Secondary | ICD-10-CM

## 2024-06-02 ENCOUNTER — Ambulatory Visit: Payer: Self-pay | Admitting: Internal Medicine

## 2024-06-11 ENCOUNTER — Other Ambulatory Visit: Payer: Self-pay | Admitting: Nurse Practitioner

## 2024-06-11 DIAGNOSIS — E782 Mixed hyperlipidemia: Secondary | ICD-10-CM

## 2024-06-28 ENCOUNTER — Ambulatory Visit: Payer: Self-pay | Admitting: Nurse Practitioner

## 2024-06-28 ENCOUNTER — Encounter: Payer: Self-pay | Admitting: Nurse Practitioner

## 2024-06-28 VITALS — BP 128/73 | HR 63 | Temp 97.5°F | Ht 78.0 in | Wt 225.8 lb

## 2024-06-28 DIAGNOSIS — I10 Essential (primary) hypertension: Secondary | ICD-10-CM

## 2024-06-28 DIAGNOSIS — E663 Overweight: Secondary | ICD-10-CM | POA: Insufficient documentation

## 2024-06-28 DIAGNOSIS — E1169 Type 2 diabetes mellitus with other specified complication: Secondary | ICD-10-CM

## 2024-06-28 DIAGNOSIS — E782 Mixed hyperlipidemia: Secondary | ICD-10-CM

## 2024-06-28 DIAGNOSIS — I2510 Atherosclerotic heart disease of native coronary artery without angina pectoris: Secondary | ICD-10-CM

## 2024-06-28 DIAGNOSIS — Z23 Encounter for immunization: Secondary | ICD-10-CM

## 2024-06-28 LAB — BAYER DCA HB A1C WAIVED: HB A1C (BAYER DCA - WAIVED): 5.6 % (ref 4.8–5.6)

## 2024-06-28 NOTE — Progress Notes (Signed)
 Subjective:  Patient ID: Luis Ford, male    DOB: 12-15-1960, 63 y.o.   MRN: [id number]  Patient Care Team: Deitra Morton Hummer, Nena, NP as PCP - General (Nurse Practitioner) Stacia Diannah SQUIBB, MD as PCP - Cardiology (Cardiology) Shaaron Lamar HERO, MD (Gastroenterology)   Chief Complaint:  Medical Management of Chronic Issues   HPI: Luis Ford is a 63 y.o. male presenting on 06/28/2024 for Medical Management of Chronic Issues   Discussed the use of AI scribe software for clinical note transcription with the patient, who gave verbal consent to proceed.  History of Present Illness Luis Ford is a 64 year old male with hypertension, diabetes, and coronary artery disease who presents for chronic disease management.  He feels more energetic and is able to work in the yard without experiencing chest pain. He started metoprolol  after his last cardiology visit on April 20, 2024. He is scheduled for a follow-up cardiology appointment in February. His last echocardiogram was on April 28, 2024, and a stress test was conducted in September 2025.  He is currently taking metoprolol  25 mg twice a day, Crestor , nitroglycerin  as needed, lisinopril  20 mg daily, Zetia  10 mg daily, B complex, aspirin , and amlodipine . He reports that he has not experienced shortness of breath recently and has not needed medication refills.  He does not add salt to his food and is trying to reduce salt intake in his diet. He completed a Cologuard test in September 2025, which was reported as normal, and he is due for another test in two to three years.  His A1c was checked today and was 5.6. He plans to stay around the house for Christmas due to the cold weather  He is overweight with a BMI of 26.09 as he reports that he had been working on decreasing sweets.    Echo 04/19/2024  Baseline non-specific ST-T changes in the inferior leads are exaggerated with Lexiscan  but did not meet criteria for ST  deviation.   LV perfusion is abnormal. There is evidence of ischemia. There is no evidence of infarction. There is a medium sized reversible perfusion defect with mild reduction in uptake present in the mid to basal inferior wall with normal wall motion in the defect area consistent with ischemia.   Left ventricular function is normal. Nuclear stress EF: 76%.   Findings are consistent with ischemia and no infarction. The study is intermediate risk.  Relevant past medical, surgical, family, and social history reviewed and updated as indicated.  Allergies and medications reviewed and updated. Data reviewed: Chart in Epic.   Past Medical History:  Diagnosis Date   Asthma    Complication of anesthesia    patient states he can only have spinal anesthesia.     Complication of anesthesia    Patient stopped breathing after geneal anesthesia.   Diabetes mellitus without complication (HCC)    Hyperlipemia    Hypertension    MRSA infection    Sleep apnea    Toe amputee     Past Surgical History:  Procedure Laterality Date   APPENDECTOMY     COLONOSCOPY  2003   [id number]   COLONOSCOPY  04/15/2012   Procedure: COLONOSCOPY;  Surgeon: Lamar HERO Shaaron, MD;  Location: [facility];  Service: Endoscopy;  Laterality: N/A;  8:45   HERNIA REPAIR     right inguinal   TOE AMPUTATION     TOTAL KNEE ARTHROPLASTY Right 10/09/2020   Procedure: RIGHT TOTAL  KNEE ARTHROPLASTY;  Surgeon: Margrette Taft BRAVO, MD;  Location: [facility];  Service: Orthopedics;  Laterality: Right;   TRACHEOSTOMY     infant/closure as child   TRACHEOSTOMY     pt was 28 days old when he had a trach placed.     Social History   Socioeconomic History   Marital status: Single    Spouse name: Not on file   Number of children: 2   Years of education: Not on file   Highest education level: Not on file  Occupational History   Occupation: Tour Manager: [employer]  Tobacco Use   Smoking status: Never   Smokeless  tobacco: Never  Vaping Use   Vaping status: Never Used  Substance and Sexual Activity   Alcohol use: No   Drug use: No   Sexual activity: Not on file  Other Topics Concern   Not on file  Social History Narrative   2 healthy sons    Lives w/ girlfriend   Social Drivers of Corporate Investment Banker Strain: Low Risk (04/04/2022)   Received from [facility]   Overall Financial Resource Strain (CARDIA)    Difficulty of Paying Living Expenses: Not hard at all  Food Insecurity: No Food Insecurity (04/04/2022)   Received from [facility]   Hunger Vital Sign    Within the past 12 months, you worried that your food would run out before you got the money to buy more.: Never true    Within the past 12 months, the food you bought just didn't last and you didn't have money to get more.: Never true  Transportation Needs: No Transportation Needs (04/04/2022)   Received from [facility]   PRAPARE - Transportation    Lack of Transportation (Medical): No    Lack of Transportation (Non-Medical): No  Physical Activity: Not on file  Stress: Not on file  Social Connections: Not on file  Intimate Partner Violence: Not on file    Outpatient Encounter Medications as of 06/28/2024  Medication Sig   amLODipine  (NORVASC ) 5 MG tablet TAKE 1 TABLET(5 MG) BY MOUTH DAILY   aspirin  81 MG chewable tablet CHEW AND SWALLOW 1 TABLET(81 MG) BY MOUTH DAILY   b complex vitamins capsule Take 1 capsule by mouth daily.   ezetimibe  (ZETIA ) 10 MG tablet TAKE 1 TABLET(10 MG) BY MOUTH DAILY   lisinopril  (ZESTRIL ) 20 MG tablet Take 1 tablet (20 mg total) by mouth daily.   metFORMIN  (GLUCOPHAGE -XR) 500 MG 24 hr tablet TAKE 1 TABLET(500 MG) BY MOUTH TWICE DAILY WITH A MEAL   metoprolol  tartrate (LOPRESSOR ) 25 MG tablet Take 1 tablet (25 mg total) by mouth 2 (two) times daily.   nitroGLYCERIN  (NITROSTAT ) 0.4 MG SL tablet Place 1 tablet (0.4 mg total) under the tongue every 5 (five) minutes x 3 doses as needed (if  no relief after 3rd dose proceed to ED or call 911).   rosuvastatin  (CRESTOR ) 40 MG tablet TAKE 1 TABLET(40 MG) BY MOUTH DAILY   guaifenesin  (HUMIBID E) 400 MG TABS tablet Take 1 tablet (400 mg total) by mouth every 6 (six) hours as needed. (Patient not taking: Reported on 06/28/2024)   No facility-administered encounter medications on file as of 06/28/2024.    Allergies  Allergen Reactions   Penicillins Other (See Comments)    Childhood illness     Pertinent ROS per HPI, otherwise unremarkable      Objective:  BP 128/73   Pulse 63  Temp (!) 97.5 F (36.4 C)   Ht 6' 6 (1.981 m)   Wt 225 lb 12.8 oz (102.4 kg)   SpO2 98%   BMI 26.09 kg/m    Wt Readings from Last 3 Encounters:  06/28/24 225 lb 12.8 oz (102.4 kg)  05/23/24 228 lb 12.8 oz (103.8 kg)  04/13/24 226 lb 3.2 oz (102.6 kg)   BP Readings from Last 3 Encounters:  06/28/24 128/73  05/23/24 124/70  04/13/24 114/74     Physical Exam Vitals and nursing note reviewed.  Constitutional:      General: He is not in acute distress. HENT:     Head: Normocephalic and atraumatic.     Nose: Nose normal.     Mouth/Throat:     Mouth: Mucous membranes are moist.  Eyes:     General: No scleral icterus.    Extraocular Movements: Extraocular movements intact.     Conjunctiva/sclera: Conjunctivae normal.     Pupils: Pupils are equal, round, and reactive to light.  Cardiovascular:     Heart sounds: Normal heart sounds.  Pulmonary:     Effort: Pulmonary effort is normal.     Breath sounds: Normal breath sounds.  Abdominal:     General: Bowel sounds are normal.     Palpations: Abdomen is soft.  Musculoskeletal:        General: Normal range of motion.     Right lower leg: No edema.     Left lower leg: No edema.  Skin:    General: Skin is warm and dry.     Findings: No rash.  Neurological:     Mental Status: He is alert and oriented to person, place, and time.  Psychiatric:        Mood and Affect: Mood normal.         Behavior: Behavior normal.        Thought Content: Thought content normal.        Judgment: Judgment normal.    Physical Exam      Results for orders placed or performed in visit on 04/28/24  ECHOCARDIOGRAM COMPLETE   Collection Time: 04/28/24  2:59 PM  Result Value Ref Range   S' Lateral 2.70 cm   Area-P 1/2 4.21 cm2   Single Plane A2C EF 73.9 %   Single Plane A4C EF 66.5 %   Calc EF 70.4 %   AR max vel 3.03 cm2   Ao pk vel 1.62 m/s   AV Peak grad 10.5 mmHg   Est EF 55 - 60%        Pertinent labs & imaging results that were available during my care of the patient were reviewed by me and considered in my medical decision making.  Assessment & Plan:  Waller was seen today for medical management of chronic issues.  Diagnoses and all orders for this visit:  Type 2 diabetes mellitus with other specified complication, without long-term current use of insulin  (HCC) -     Bayer DCA Hb A1c Waived  Essential hypertension  Mixed hyperlipidemia  Coronary artery calcification     Assessment and Plan Octaviano is a 63 year old Caucasian male seen today for chronic disease management, no acute distress Assessment & Plan Type 2 diabetes mellitus with other specified complication Diabetes is well controlled with an A1c of 5.6%.  Essential hypertension Hypertension is managed with multiple antihypertensive medications. - Continue current antihypertensive regimen. - Advised reducing salt intake in cooking.  Mixed hyperlipidemia Managed with Crestor  and Zetia . -  Continue current lipid-lowering therapy.  Atherosclerotic heart disease of native coronary artery Coronary artery disease is managed with metoprolol , resulting in improved symptoms. Recent cardiology follow-up was satisfactory. - Continue metoprolol  25 mg twice daily. - Follow up with cardiology in February.  Health maintenance: Up-to-date    Continue all other maintenance medications.  Follow up plan: Return in  about 3 months (around 09/26/2024) for chronic Diseases Management.   Continue healthy lifestyle choices, including diet (rich in fruits, vegetables, and lean proteins, and low in salt and simple carbohydrates) and exercise (at least 30 minutes of moderate physical activity daily).  Educational handout given for   Clinical References  Coronary Artery Disease, Male Coronary artery disease (CAD) is a condition in which the arteries that lead to the heart (coronary arteries) become narrow or blocked. The narrowing or blockage can lead to decreased blood flow to the heart. Prolonged reduced blood flow can cause a heart attack (myocardial infarction, or MI). This condition may also be called coronary heart disease. CAD is the most common type of heart disease, and heart disease is the leading cause of death in men. It is important to understand what causes CAD and how it is treated. What are the causes? CAD is most often caused by atherosclerosis. This is the buildup of fat and cholesterol (plaque) on the inside of the arteries. Over time, the plaque may narrow or block the artery, reducing blood flow to the heart. Plaque can also become weak and break off within a coronary artery and cause a sudden blockage. Other less common causes of CAD include: A blood clot or a piece of another substance that blocks the flow of blood in a coronary artery (embolism). A tearing of the artery (spontaneous coronary artery dissection). An enlargement of an artery (aneurysm). Inflammation (vasculitis) in the artery wall. What increases the risk? The following factors may make you more likely to develop this condition: Age. Men older than 45 years are at a greater risk of CAD. Family history of CAD. High blood pressure (hypertension). Diabetes. High cholesterol levels. Obesity. Other risk factors include: Tobacco use. Excessive alcohol use. Lack of exercise. A diet high in saturated and trans fats, such as fried  food and processed meat. What are the signs or symptoms? Many people do not have any symptoms during the early stages of CAD. As the condition progresses, symptoms may include: Chest pain (angina). The pain can: Feel like crushing or squeezing, or like a tightness, pressure, fullness, or heaviness in the chest. Last more than a few minutes or can stop and recur. The pain tends to get worse with exercise or stress and to fade with rest. Pain in the arms, neck, jaw, ear, or back. Unexplained heartburn or indigestion. Shortness of breath. Nausea or vomiting. Sudden light-headedness. Sudden cold sweats. Fluttering or fast heartbeat (palpitations). How is this diagnosed? This condition is diagnosed based on: Your family and medical history. A physical exam. Tests. These may include: A test to check the electrical signals in your heart (electrocardiogram). Exercise stress test. This looks for signs of blockage when the heart is stressed with exercise, such as running on a treadmill. Pharmacologic stress test. This test looks for signs of blockage when the heart is being stressed with a medicine. Blood tests to check levels of cardiac enzymes such as troponin and creatine kinase. Coronary angiogram. This is a procedure to look at the coronary arteries to see if there is any blockage. During this test, a dye is  injected into your arteries so they appear on an X-ray. Coronary artery CT scan. This scan helps detect calcium  deposits in your coronary arteries. Calcium  deposits are an indicator of CAD. A test that uses sound waves to take a picture of your heart (echocardiogram). How is this treated? This condition may be treated by: Healthy lifestyle changes to reduce risk factors. Medicines such as: Antiplatelet medicines such as clopidogrel or aspirin . These help to prevent blood clots. Nitroglycerin . Blood pressure medicines. Cholesterol-lowering medicine. Coronary angioplasty and stenting.  During this procedure, a thin, flexible tube is inserted through a blood vessel and into a blocked artery. A balloon or similar device on the end of the tube is inflated to open up the artery. In some cases, a small, mesh tube (stent) is inserted into the artery to keep it open. Coronary artery bypass surgery. During this surgery, veins or arteries from other parts of the body are used to create a bypass around the blockage and allow blood to reach your heart. Follow these instructions at home: Medicines Take over-the-counter and prescription medicines only as told by your health care provider. Do not take the following medicines unless your health care provider approves: NSAIDs, such as ibuprofen , naproxen, or celecoxib . Vitamin supplements that contain vitamin A, vitamin E, or both. Lifestyle Follow an exercise program approved by your health care provider. Ask your health care provider if cardiac rehab is appropriate. Maintain a healthy weight or lose weight as approved by your health care provider. Learn to manage stress or try to limit your stress. Ask your health care provider for suggestions if you need help. Get screened for depression and seek treatment, if needed. Do not use any products that contain nicotine or tobacco. These products include cigarettes, chewing tobacco, and vaping devices, such as e-cigarettes. If you need help quitting, ask your health care provider. Eating and drinking  Follow a heart-healthy diet. A dietitian can help educate you about healthy food options and changes. In general, eat plenty of fruits and vegetables, lean meats, and whole grains. Avoid foods high in: Sugar. Salt (sodium). Saturated fat, such as processed or fatty meat. Trans fat, such as fried foods. Use healthy cooking methods such as roasting, grilling, broiling, baking, poaching, steaming, or stir-frying. Do not drink alcohol if your health care provider tells you not to drink. If you drink  alcohol: Limit how much you have to 0-2 drinks a day. Know how much alcohol is in your drink. In the [place] one drink equals one 12 oz bottle of beer (355 mL), one 5 oz glass of wine (148 mL), or one 1 oz glass of hard liquor (44 mL). General instructions Manage any other health conditions, such as high cholesterol, hypertension, and diabetes. These conditions affect your heart. Your health care provider may ask you to monitor your blood pressure. Keep all follow-up visits. This is important. Get help right away if: You have pain in your chest, neck, ear, arm, jaw, stomach, or back that: Lasts more than a few minutes. Is recurring. Is not relieved by taking medicine under your tongue (sublingual nitroglycerin ). You have profuse sweating without cause. You have unexplained: Heartburn or indigestion. Shortness of breath or difficulty breathing. Fluttering or fast heartbeat (palpitations). Nausea or vomiting. Fatigue or weakness. Feelings of nervousness or anxiety. You have sudden light-headedness or dizziness. You faint. These symptoms may be an emergency. Get help right away. Call 911. Do not wait to see if the symptoms will go away. Do not drive  yourself to the hospital. Summary Coronary artery disease (CAD) is a condition in which the arteries that lead to the heart (coronary arteries) become narrow or blocked. Prolonged reduced blood flow can cause a heart attack. CAD can be treated with lifestyle changes, medicines, coronary angioplasty or stents, coronary artery bypass surgery, or a combination of these treatments. Keep all follow-up visits. This is important. This information is not intended to replace advice given to you by your health care provider. Make sure you discuss any questions you have with your health care provider. Document Revised: 06/05/2021 Document Reviewed: 06/05/2021 Elsevier Patient Education  2024 Elsevier Inc. Hypertension, Adult High blood pressure  (hypertension) is when the force of blood pumping through the arteries is too strong. The arteries are the blood vessels that carry blood from the heart throughout the body. Hypertension forces the heart to work harder to pump blood and may cause arteries to become narrow or stiff. Untreated or uncontrolled hypertension can lead to a heart attack, heart failure, a stroke, kidney disease, and other problems. A blood pressure reading consists of a higher number over a lower number. Ideally, your blood pressure should be below 120/80. The first (top) number is called the systolic pressure. It is a measure of the pressure in your arteries as your heart beats. The second (bottom) number is called the diastolic pressure. It is a measure of the pressure in your arteries as the heart relaxes. What are the causes? The exact cause of this condition is not known. There are some conditions that result in high blood pressure. What increases the risk? Certain factors may make you more likely to develop high blood pressure. Some of these risk factors are under your control, including: Smoking. Not getting enough exercise or physical activity. Being overweight. Having too much fat, sugar, calories, or salt (sodium) in your diet. Drinking too much alcohol. Other risk factors include: Having a personal history of heart disease, diabetes, high cholesterol, or kidney disease. Stress. Having a family history of high blood pressure and high cholesterol. Having obstructive sleep apnea. Age. The risk increases with age. What are the signs or symptoms? High blood pressure may not cause symptoms. Very high blood pressure (hypertensive crisis) may cause: Headache. Fast or irregular heartbeats (palpitations). Shortness of breath. Nosebleed. Nausea and vomiting. Vision changes. Severe chest pain, dizziness, and seizures. How is this diagnosed? This condition is diagnosed by measuring your blood pressure while you  are seated, with your arm resting on a flat surface, your legs uncrossed, and your feet flat on the floor. The cuff of the blood pressure monitor will be placed directly against the skin of your upper arm at the level of your heart. Blood pressure should be measured at least twice using the same arm. Certain conditions can cause a difference in blood pressure between your right and left arms. If you have a high blood pressure reading during one visit or you have normal blood pressure with other risk factors, you may be asked to: Return on a different day to have your blood pressure checked again. Monitor your blood pressure at home for 1 week or longer. If you are diagnosed with hypertension, you may have other blood or imaging tests to help your health care provider understand your overall risk for other conditions. How is this treated? This condition is treated by making healthy lifestyle changes, such as eating healthy foods, exercising more, and reducing your alcohol intake. You may be referred for counseling on a healthy  diet and physical activity. Your health care provider may prescribe medicine if lifestyle changes are not enough to get your blood pressure under control and if: Your systolic blood pressure is above 130. Your diastolic blood pressure is above 80. Your personal target blood pressure may vary depending on your medical conditions, your age, and other factors. Follow these instructions at home: Eating and drinking  Eat a diet that is high in fiber and potassium, and low in sodium, added sugar, and fat. An example of this eating plan is called the DASH diet. DASH stands for Dietary Approaches to Stop Hypertension. To eat this way: Eat plenty of fresh fruits and vegetables. Try to fill one half of your plate at each meal with fruits and vegetables. Eat whole grains, such as whole-wheat pasta, brown rice, or whole-grain bread. Fill about one fourth of your plate with whole  grains. Eat or drink low-fat dairy products, such as skim milk or low-fat yogurt. Avoid fatty cuts of meat, processed or cured meats, and poultry with skin. Fill about one fourth of your plate with lean proteins, such as fish, chicken without skin, beans, eggs, or tofu. Avoid pre-made and processed foods. These tend to be higher in sodium, added sugar, and fat. Reduce your daily sodium intake. Many people with hypertension should eat less than 1,500 mg of sodium a day. Do not drink alcohol if: Your health care provider tells you not to drink. You are pregnant, may be pregnant, or are planning to become pregnant. If you drink alcohol: Limit how much you have to: 0-1 drink a day for women. 0-2 drinks a day for men. Know how much alcohol is in your drink. In the [place] one drink equals one 12 oz bottle of beer (355 mL), one 5 oz glass of wine (148 mL), or one 1 oz glass of hard liquor (44 mL). Lifestyle  Work with your health care provider to maintain a healthy body weight or to lose weight. Ask what an ideal weight is for you. Get at least 30 minutes of exercise that causes your heart to beat faster (aerobic exercise) most days of the week. Activities may include walking, swimming, or biking. Include exercise to strengthen your muscles (resistance exercise), such as Pilates or lifting weights, as part of your weekly exercise routine. Try to do these types of exercises for 30 minutes at least 3 days a week. Do not use any products that contain nicotine or tobacco. These products include cigarettes, chewing tobacco, and vaping devices, such as e-cigarettes. If you need help quitting, ask your health care provider. Monitor your blood pressure at home as told by your health care provider. Keep all follow-up visits. This is important. Medicines Take over-the-counter and prescription medicines only as told by your health care provider. Follow directions carefully. Blood pressure medicines must be taken  as prescribed. Do not skip doses of blood pressure medicine. Doing this puts you at risk for problems and can make the medicine less effective. Ask your health care provider about side effects or reactions to medicines that you should watch for. Contact a health care provider if you: Think you are having a reaction to a medicine you are taking. Have headaches that keep coming back (recurring). Feel dizzy. Have swelling in your ankles. Have trouble with your vision. Get help right away if you: Develop a severe headache or confusion. Have unusual weakness or numbness. Feel faint. Have severe pain in your chest or abdomen. Vomit repeatedly. Have trouble breathing.  These symptoms may be an emergency. Get help right away. Call 911. Do not wait to see if the symptoms will go away. Do not drive yourself to the hospital. Summary Hypertension is when the force of blood pumping through your arteries is too strong. If this condition is not controlled, it may put you at risk for serious complications. Your personal target blood pressure may vary depending on your medical conditions, your age, and other factors. For most people, a normal blood pressure is less than 120/80. Hypertension is treated with lifestyle changes, medicines, or a combination of both. Lifestyle changes include losing weight, eating a healthy, low-sodium diet, exercising more, and limiting alcohol. This information is not intended to replace advice given to you by your health care provider. Make sure you discuss any questions you have with your health care provider. Document Revised: 05/14/2021 Document Reviewed: 05/14/2021 Elsevier Patient Education  2024 Else[employer] Inc. Dyslipidemia Dyslipidemia is an imbalance of waxy, fat-like substances (lipids) in the blood. The body needs lipids in small amounts. Dyslipidemia often involves a high level of cholesterol or triglycerides, which are types of lipids. Common forms of dyslipidemia  include: High levels of LDL cholesterol. LDL is the type of cholesterol that causes fatty deposits (plaques) to build up in the blood vessels that carry blood away from the heart (arteries). Low levels of HDL cholesterol. HDL cholesterol is the type of cholesterol that protects against heart disease. High levels of HDL remove the LDL buildup from arteries. High levels of triglycerides. Triglycerides are a fatty substance in the blood that is linked to a buildup of plaques in the arteries. What are the causes? There are two main types of dyslipidemia: primary and secondary. Primary dyslipidemia is caused by changes (mutations) in genes that are passed down through families (inherited). These mutations cause several types of dyslipidemia. Secondary dyslipidemia may be caused by various risk factors that can lead to the disease, such as lifestyle choices and certain medical conditions. What increases the risk? You are more likely to develop this condition if you are an older man or if you are a woman who has gone through menopause. Other risk factors include: Having a family history of dyslipidemia. Taking certain medicines, including birth control pills, steroids, some diuretics, and beta-blockers. Eating a diet high in saturated fat. Smoking cigarettes or excessive alcohol intake. Having certain medical conditions such as diabetes, polycystic ovary syndrome (PCOS), kidney disease, liver disease, or hypothyroidism. Not exercising regularly. Being overweight or obese with too much belly fat. What are the signs or symptoms? In most cases, dyslipidemia does not usually cause any symptoms. In severe cases, very high lipid levels can cause: Fatty bumps under the skin (xanthomas). A white or gray ring around the black center (pupil) of the eye. Very high triglyceride levels can cause inflammation of the pancreas (pancreatitis). How is this diagnosed? Your health care provider may diagnose dyslipidemia  based on a routine blood test (fasting blood test). Because most people do not have symptoms of the condition, this blood testing (lipid profile) is done on adults age 31 and older and is repeated every 4-6 years. This test checks: Total cholesterol. This measures the total amount of cholesterol in your blood, including LDL cholesterol, HDL cholesterol, and triglycerides. A healthy number is below 200 mg/dL (4.82 mmol/L). LDL cholesterol. The target number for LDL cholesterol is different for each person, depending on individual risk factors. A healthy number is usually below 100 mg/dL (7.40 mmol/L). Ask your health care  provider what your LDL cholesterol should be. HDL cholesterol. An HDL level of 60 mg/dL (8.44 mmol/L) or higher is best because it helps to protect against heart disease. A number below 40 mg/dL (8.96 mmol/L) for men or below 50 mg/dL (8.70 mmol/L) for women increases the risk for heart disease. Triglycerides. A healthy triglyceride number is below 150 mg/dL (8.30 mmol/L). If your lipid profile is abnormal, your health care provider may do other blood tests. How is this treated? Treatment depends on the type of dyslipidemia that you have and your other risk factors for heart disease and stroke. Your health care provider will have a target range for your lipid levels based on this information. Treatment for dyslipidemia starts with lifestyle changes, such as diet and exercise. Your health care provider may recommend that you: Get regular exercise. Make changes to your diet. Quit smoking if you smoke. Limit your alcohol intake. If diet changes and exercise do not help you reach your goals, your health care provider may also prescribe medicine to lower lipids. The most commonly prescribed type of medicine lowers your LDL cholesterol (statin drug). If you have a high triglyceride level, your provider may prescribe another type of drug (fibrate) or an omega-3 fish oil supplement, or  both. Follow these instructions at home: Eating and drinking  Follow instructions from your health care provider or dietitian about eating or drinking restrictions. Eat a healthy diet as told by your health care provider. This can help you reach and maintain a healthy weight, lower your LDL cholesterol, and raise your HDL cholesterol. This may include: Limiting your calories, if you are overweight. Eating more fruits, vegetables, whole grains, fish, and lean meats. Limiting saturated fat, trans fat, and cholesterol. Do not drink alcohol if: Your health care provider tells you not to drink. You are pregnant, may be pregnant, or are planning to become pregnant. If you drink alcohol: Limit how much you have to: 0-1 drink a day for women. 0-2 drinks a day for men. Know how much alcohol is in your drink. In the [place] one drink equals one 12 oz bottle of beer (355 mL), one 5 oz glass of wine (148 mL), or one 1 oz glass of hard liquor (44 mL). Activity Get regular exercise. Start an exercise and strength training program as told by your health care provider. Ask your health care provider what activities are safe for you. Your health care provider may recommend: 30 minutes of aerobic activity 4-6 days a week. Brisk walking is an example of aerobic activity. Strength training 2 days a week. General instructions Do not use any products that contain nicotine or tobacco. These products include cigarettes, chewing tobacco, and vaping devices, such as e-cigarettes. If you need help quitting, ask your health care provider. Take over-the-counter and prescription medicines only as told by your health care provider. This includes supplements. Keep all follow-up visits. This is important. Contact a health care provider if: You are having trouble sticking to your exercise or diet plan. You are struggling to quit smoking or to control your use of alcohol. Summary Dyslipidemia often involves a high level of  cholesterol or triglycerides, which are types of lipids. Treatment depends on the type of dyslipidemia that you have and your other risk factors for heart disease and stroke. Treatment for dyslipidemia starts with lifestyle changes, such as diet and exercise. Your health care provider may prescribe medicine to lower lipids. This information is not intended to replace advice given to you  by your health care provider. Make sure you discuss any questions you have with your health care provider. Document Revised: 02/07/2022 Document Reviewed: 09/10/2020 Elsevier Patient Education  2025 [employer]. Preventing Diabetes Mellitus Complications You can help to prevent or slow down problems that are caused by diabetes (diabetes mellitus). If you follow your diabetes plan and take care of yourself, you can lower your chances of having severe problems. What actions can I take to prevent problems caused by diabetes? Managing your diabetes  Follow instructions from your health care providers about how to manage your diabetes. You may have a team of health care providers. They can teach you how to care for yourself and can answer any questions you have. Learn about your condition. This can help you make healthy choices when it comes to eating and physical activity. Know your target range for your blood sugar (glucose). Check your blood glucose levels. Your health care provider will help you decide how often to check your levels. How often you check may depend on your goals for treatment and how well you are meeting them. Ask your health care provider if you should take a low dose of aspirin  every day. Ask what dose is best for you. Taking a low dose of aspirin  can help prevent heart disease. Controlling your blood pressure and cholesterol Your target blood pressure is based on: Your age. Your medicines. How long you have had diabetes. Other conditions you have. Controlling your cholesterol may: Help  prevent heart disease and stroke. Improve your blood flow. To control your blood pressure and cholesterol: Follow instructions from your health care provider about meal planning, exercise, and medicines. Make sure your health care provider checks your blood pressure at every visit. Monitor your blood pressure at home as told by your health care provider. Have your cholesterol checked at least once a year. A medicine called statin can help to lower your cholesterol. Ask your health care provider if you are or should be taking a statin.   Medical appointments and vaccines Have yearly physical exams and eye exams. Your health care providers will tell you how often you need to see them. It may depend on your diabetes plan. Every visit with a health care provider should include a measure of: Your weight. Your blood pressure. Your blood glucose. Your A1C level should be checked: At least 2 times a year, if you are meeting your treatment goals. 4 times a year, if you are not meeting treatment goals or if your goals have changed. Your blood lipids (lipid profile) should be checked once a year. You should also be checked once a year for protein in your urine (urine microalbumin). If you have type 1 diabetes, get an eye exam within 5 years after you are diagnosed. Get an exam once a year after that first exam. If you have type 2 diabetes, get an eye exam as soon as you are diagnosed. Get an exam once a year after that first exam. Keep your vaccines current. You should get: A flu vaccine every year. A pneumonia vaccine and a hepatitis B vaccine. If you are 65 years or older, you may get the pneumonia vaccine as a series of two shots. Ask your health care provider what other vaccines you may need to get. Keep all follow-up visits. This can help ensure that problems can be avoided or found early and treated. Lifestyle Do not use any products that contain nicotine or tobacco. These products include  cigarettes, chewing  tobacco, and vaping devices, such as e-cigarettes. If you need help quitting, ask your health care provider. If you quit smoking, you may: Lower your risk for heart attack, stroke, nerve disease, and kidney disease. Help your blood move through your body better. Help your blood pressure and cholesterol levels. Do not drink alcohol if: Your health care provider tells you not to drink. You are pregnant, may be pregnant, or are planning to become pregnant. If you drink alcohol: Limit how much you have to: 0-1 drink a day for women. 0-2 drinks a day for men. Know how much alcohol is in your drink. In the [place] one drink equals one 12 oz bottle of beer (355 mL), one 5 oz glass of wine (148 mL), or one 1 oz glass of hard liquor (44 mL). Taking care of your feet Diabetes may cause you to have poor blood flow to your legs and feet. It can also cause: The skin on your feet to get thinner, break more easily, and heal more slowly. Nerve damage in your legs and feet. This can result in less feeling. You may not notice small injuries. This could lead to bigger problems. To avoid foot problems: Check your skin and feet every day for cuts, bruises, redness, blisters, or sores. Have a foot exam with your health care provider once a year. During the exam, your health care provider will: Look at the structure and skin of your feet. Check your pulses and amount of feeling in your feet. Make sure that your health care provider does a visual foot exam at every visit.   Taking care of your teeth People who have diabetes that is not controlled well are more likely to have gum disease (periodontal disease). Diabetes can make gum disease harder to control. If not treated, it can lead to tooth loss. To prevent this: Brush your teeth twice a day. Floss at least once a day. Visit your dentist 2 times a year. Managing stress Living with diabetes can be stressful. When you are stressed, you  may: Have higher blood glucose because of stress hormones. Be distracted from taking good care of yourself. Be aware of your stress level and make changes to help you manage tough times. To lower your stress levels: Think about joining a support group. Do planned relaxation or meditation. Do a hobby that you enjoy. Maintain healthy relationships. Try to exercise every day. Work with your health care provider or a mental health professional. Where to find more information American Diabetes Association: diabetes.org Association of Diabetes Care & Education Specialists: diabeteseducator.org This information is not intended to replace advice given to you by your health care provider. Make sure you discuss any questions you have with your health care provider. Document Revised: 01/08/2022 Document Reviewed: 01/08/2022 Elsevier Patient Education  2024 Elsevier Inc. Cough, Adult A cough helps to clear your throat and lungs. It may be a sign of an illness or another condition. A short-term (acute) cough may last 2-3 weeks. A long-term (chronic) cough may last 8 or more weeks. Many things can cause a cough. They include: Illnesses such as: An infection in your throat or lungs. Asthma or other heart or lung problems. Gastroesophageal reflux. This is when acid comes back up from your stomach. Breathing in things that bother (irritate) your lungs. Allergies. Postnasal drip. This is when mucus runs down the back of your throat. Smoking. Some medicines. Follow these instructions at home: Medicines Take over-the-counter and prescription medicines only as told by  your doctor. Talk with your doctor before you take cough medicine (cough suppressants). Eating and drinking Do not drink alcohol. Do not drink caffeine. Drink enough fluid to keep your pee (urine) pale yellow. Lifestyle Stay away from cigarette smoke. Do not smoke or use any products that contain nicotine or tobacco. If you need help  quitting, ask your doctor. Stay away from things that make you cough. These may include perfume, candles, cleaning products, or campfire smoke. General instructions  Watch for any changes to your cough. Tell your doctor about them. Always cover your mouth when you cough. If the air is dry in your home, use a cool mist vaporizer or humidifier. If your cough is worse at night, try using extra pillows to raise your head up higher while you sleep. Rest as needed. Contact a doctor if: You have new symptoms. Your symptoms get worse. You cough up pus. You have a fever that does not go away. Your cough does not get better after 2-3 weeks. Cough medicine does not help, and you are not sleeping well. You have pain that gets worse or is not helped with medicine. You are losing weight and do not know why. You have night sweats. Get help right away if: You cough up blood. You have trouble breathing. Your heart is beating very fast. These symptoms may be an emergency. Get help right away. Call 911. Do not wait to see if the symptoms will go away. Do not drive yourself to the hospital. This information is not intended to replace advice given to you by your health care provider. Make sure you discuss any questions you have with your health care provider. Document Revised: 03/07/2022 Document Reviewed: 03/07/2022 Elsevier Patient Education  2024 Elsevier Inc.  The above assessment and management plan was discussed with the patient. The patient verbalized understanding of and has agreed to the management plan. Patient is aware to call the clinic if they develop any new symptoms or if symptoms persist or worsen. Patient is aware when to return to the clinic for a follow-up visit. Patient educated on when it is appropriate to go to the emergency department.   Torrion Luis [place] Thompson, DNP [facility] [address] [place], [place] [address] [phone]

## 2024-06-29 ENCOUNTER — Telehealth: Payer: Self-pay | Admitting: Pharmacy Technician

## 2024-06-29 ENCOUNTER — Other Ambulatory Visit (HOSPITAL_COMMUNITY): Payer: Self-pay

## 2024-06-29 MED ORDER — REPATHA SURECLICK 140 MG/ML ~~LOC~~ SOAJ: 140.0000 mg | SUBCUTANEOUS | 2 refills | Status: AC

## 2024-06-29 NOTE — Telephone Encounter (Signed)
 Pharmacy Patient Advocate Encounter   Received notification from Physician's Office that prior authorization for repatha is required/requested.   Insurance verification completed.   The patient is insured through [facility] MEDICAID.   Per test claim: PA required; PA submitted to above mentioned insurance via Latent Key/confirmation #/[id number] Status is pending

## 2024-06-29 NOTE — Telephone Encounter (Signed)
-----   Message from Vishnu P Mallipeddi sent at 06/02/2024 12:31 PM EST ----- LP(a) significantly elevated, 158.  Start Repatha.  Patient agreeable per recent office visit note. ----- Message ----- From: Delbra Krebs T Sent: 05/23/2024   5:38 PM EST To: Vishnu P Mallipeddi, MD

## 2024-06-29 NOTE — Telephone Encounter (Signed)
 The patient has been notified of the result and verbalized understanding.  All questions (if any) were answered. Patient also expressed he wasn't sure that he would take the medication now. Uncomfortable about given himself the injection. Advised him that the pharmacy can show him how to use it as well. Patient stated he will see and call us  or discuss at next office visit  Littie CHRISTELLA Croak, CMA 06/29/2024 2:51 PM

## 2024-06-30 ENCOUNTER — Other Ambulatory Visit (HOSPITAL_COMMUNITY): Payer: Self-pay

## 2024-06-30 NOTE — Telephone Encounter (Signed)
 Pharmacy Patient Advocate Encounter  Received notification from [facility] MEDICAID that Prior Authorization for Repatha has been APPROVED from 06/30/24 to 06/30/25. Ran test claim, Copay is $4.00- one month. This test claim was processed through [facility]- copay amounts may vary at other pharmacies due to pharmacy/plan contracts, or as the patient moves through the different stages of their insurance plan.   PA #/Case ID/Reference #: [id number]

## 2024-07-19 ENCOUNTER — Telehealth: Payer: Self-pay | Admitting: Internal Medicine

## 2024-07-19 NOTE — Telephone Encounter (Signed)
 Spoke with patient regarding his medications. Advised him that Dr. Mallipeddi hasn't increased his medications. Reviewed her note his medications wasn't increased. Advised him that she started Repatha  and has been approved through his insurance. Printed off medication list for patient as well. Reviewed his blood medications and that he may need to check with PCP that manages a few of his medications to make sure they haven't increased his medication as well. Ni further issues

## 2024-07-19 NOTE — Telephone Encounter (Signed)
 Pt came into the office and stated that Dr. Mallipeddi had decreased his blood pressure medication at his last visit. He stated when he picked his med up from the pharmacy it was the same dosage he was taking before. He is not sure what he is supposed to be taking and would like to speak to a Nurse about it.

## 2024-08-23 ENCOUNTER — Encounter: Payer: Self-pay | Admitting: *Deleted

## 2024-08-24 ENCOUNTER — Other Ambulatory Visit: Payer: Self-pay | Admitting: Family Medicine

## 2024-08-24 DIAGNOSIS — R000 Tachycardia, unspecified: Secondary | ICD-10-CM

## 2024-08-24 NOTE — Progress Notes (Signed)
 Several runs of tachycardia. Avoid stimulants such as caffeine. Stay hydrated. Referral placed to see cardiology.

## 2024-09-05 ENCOUNTER — Ambulatory Visit: Admitting: Internal Medicine

## 2024-09-20 ENCOUNTER — Ambulatory Visit: Admitting: Family Medicine

## 2024-09-27 ENCOUNTER — Ambulatory Visit: Admitting: Nurse Practitioner
